# Patient Record
Sex: Male | Born: 1950
Health system: Southern US, Community
[De-identification: ages and names within clinical notes are randomized; demographics above are authoritative.]

## PROBLEM LIST (undated history)

## (undated) DIAGNOSIS — G629 Polyneuropathy, unspecified: Secondary | ICD-10-CM

## (undated) DIAGNOSIS — K269 Duodenal ulcer, unspecified as acute or chronic, without hemorrhage or perforation: Secondary | ICD-10-CM

## (undated) DIAGNOSIS — F32A Depression, unspecified: Secondary | ICD-10-CM

## (undated) DIAGNOSIS — F329 Major depressive disorder, single episode, unspecified: Secondary | ICD-10-CM

## (undated) DIAGNOSIS — J40 Bronchitis, not specified as acute or chronic: Secondary | ICD-10-CM

## (undated) DIAGNOSIS — G2581 Restless legs syndrome: Secondary | ICD-10-CM

## (undated) DIAGNOSIS — K219 Gastro-esophageal reflux disease without esophagitis: Secondary | ICD-10-CM

## (undated) HISTORY — PX: BACK SURGERY: SHX140

## (undated) HISTORY — PX: CHOLECYSTECTOMY: SHX55

## (undated) HISTORY — PX: CERVICAL FUSION: SHX112

---

## 1998-02-23 ENCOUNTER — Ambulatory Visit (HOSPITAL_COMMUNITY): Admission: RE | Admit: 1998-02-23 | Discharge: 1998-02-23 | Payer: Self-pay | Admitting: Neurosurgery

## 1998-03-06 ENCOUNTER — Encounter: Payer: Self-pay | Admitting: Neurosurgery

## 1998-03-06 ENCOUNTER — Ambulatory Visit (HOSPITAL_COMMUNITY): Admission: RE | Admit: 1998-03-06 | Discharge: 1998-03-06 | Payer: Self-pay | Admitting: Neurosurgery

## 1998-03-20 ENCOUNTER — Ambulatory Visit (HOSPITAL_COMMUNITY): Admission: RE | Admit: 1998-03-20 | Discharge: 1998-03-20 | Payer: Self-pay | Admitting: Neurosurgery

## 1998-03-20 ENCOUNTER — Encounter: Payer: Self-pay | Admitting: Neurosurgery

## 1998-04-03 ENCOUNTER — Inpatient Hospital Stay (HOSPITAL_COMMUNITY): Admission: EM | Admit: 1998-04-03 | Discharge: 1998-04-05 | Payer: Self-pay | Admitting: Emergency Medicine

## 1998-04-08 ENCOUNTER — Encounter (HOSPITAL_COMMUNITY): Admission: RE | Admit: 1998-04-08 | Discharge: 1998-07-07 | Payer: Self-pay | Admitting: Specialist

## 1998-04-17 ENCOUNTER — Encounter: Payer: Self-pay | Admitting: Neurosurgery

## 1998-04-17 ENCOUNTER — Ambulatory Visit (HOSPITAL_COMMUNITY): Admission: RE | Admit: 1998-04-17 | Discharge: 1998-04-17 | Payer: Self-pay | Admitting: Neurosurgery

## 1998-04-27 ENCOUNTER — Encounter: Payer: Self-pay | Admitting: Neurosurgery

## 1998-04-27 ENCOUNTER — Ambulatory Visit (HOSPITAL_COMMUNITY): Admission: RE | Admit: 1998-04-27 | Discharge: 1998-04-27 | Payer: Self-pay | Admitting: Neurosurgery

## 2001-02-01 ENCOUNTER — Inpatient Hospital Stay (HOSPITAL_COMMUNITY): Admission: EM | Admit: 2001-02-01 | Discharge: 2001-02-02 | Payer: Self-pay | Admitting: Cardiology

## 2003-07-17 ENCOUNTER — Encounter: Admission: RE | Admit: 2003-07-17 | Discharge: 2003-07-17 | Payer: Self-pay | Admitting: Gastroenterology

## 2003-07-27 ENCOUNTER — Ambulatory Visit (HOSPITAL_COMMUNITY): Admission: RE | Admit: 2003-07-27 | Discharge: 2003-07-28 | Payer: Self-pay | Admitting: Surgery

## 2003-07-27 ENCOUNTER — Encounter (INDEPENDENT_AMBULATORY_CARE_PROVIDER_SITE_OTHER): Payer: Self-pay | Admitting: *Deleted

## 2004-02-11 ENCOUNTER — Ambulatory Visit (HOSPITAL_COMMUNITY): Admission: RE | Admit: 2004-02-11 | Discharge: 2004-02-11 | Payer: Self-pay | Admitting: Gastroenterology

## 2004-02-11 ENCOUNTER — Encounter (INDEPENDENT_AMBULATORY_CARE_PROVIDER_SITE_OTHER): Payer: Self-pay | Admitting: Specialist

## 2004-12-24 ENCOUNTER — Emergency Department (HOSPITAL_COMMUNITY): Admission: EM | Admit: 2004-12-24 | Discharge: 2004-12-24 | Payer: Self-pay | Admitting: Emergency Medicine

## 2005-03-09 ENCOUNTER — Ambulatory Visit (HOSPITAL_COMMUNITY): Admission: RE | Admit: 2005-03-09 | Discharge: 2005-03-09 | Payer: Self-pay | Admitting: Neurology

## 2005-10-14 ENCOUNTER — Encounter: Payer: Self-pay | Admitting: Neurosurgery

## 2009-10-22 ENCOUNTER — Encounter: Admission: RE | Admit: 2009-10-22 | Discharge: 2009-10-22 | Payer: Self-pay | Admitting: Psychiatry

## 2010-10-31 NOTE — Cardiovascular Report (Signed)
[place]. [facility]  Patient:    David Solis, David Solis Visit Number: [id number] MRN: [id number]          Service Type: MED Location: 3W [phone] Attending Physician:  Talitha Givens Proc. Date: 02/02/01 Adm. Date:  02/01/2001 Disc. Date: 02/02/2001   CC:         Dr. Manuela Schwartz, Ronny Bacon, M.D. [facility]  Cardiac Catheterization Laboratory   Cardiac Catheterization  PROCEDURES PERFORMED:  Left heart catheterization with coronary angiography and left ventriculography.  INDICATIONS:  The patient is a 60 year old male with a long history of recurrent chest pain.  He is admitted to the hospital with recurrent episodes of substernal chest pain.  He was referred for cardiac catheterization to rule out coronary disease.  DESCRIPTION OF PROCEDURE:  A 6 French sheath was placed in the right femoral artery.  Standard Judkins 6 French catheters were utilized.  Contrast was Omnipaque.  Because the patient had received enoxaparin 2-1/2 hours prior to the catheterization, a Perclose vascular closure device was placed at the conclusion of the case with good hemostasis.  There were no complications.  RESULTS:  HEMODYNAMICS:  Left ventricular pressure 108/14, aortic pressure 100/62. There was no aortic valve gradient.  LEFT VENTRICULOGRAM:  Wall motion is normal.  Ejection fraction is estimated at 65%.  There is no mitral regurgitation.  CORONARY ARTERIOGRAPHY:  (Right dominant).  Left main:  Normal.  Left anterior descending:  The left anterior descending artery gives rise to a small first diagonal, normal sized second diagonal and a small third diagonal. The LAD is angiographically normal.  Left circumflex:  The left circumflex gives rise to a very small ramus intermedius and a normal sized obtuse marginal.  The circumflex is free of angiographic disease.  Right coronary artery:  The right coronary artery gives rise to a normal  sized posterior descending artery and a small posterolateral branch.  The right coronary artery is angiographically normal.  IMPRESSIONS: 1. Normal left ventricular systolic function. 2. Normal coronary arteries by angiography.  The patients chest pain appears to be noncardiac. Attending Physician:  Talitha Givens DD:  02/02/01 TD:  02/02/01 Job: [id number]

## 2010-10-31 NOTE — Op Note (Signed)
NAME:  David Solis, David Solis NO.:  [id number]   MEDICAL RECORD NO.:  [id number]                   PATIENT TYPE:  AMB   LOCATION:  ENDO                                 FACILITY:  [facility]   PHYSICIAN:  Anselmo Rod, M.D.               DATE OF BIRTH:  1951-01-25   DATE OF PROCEDURE:  02/11/2004  DATE OF DISCHARGE:                                 OPERATIVE REPORT   PROCEDURE PERFORMED:  Colonoscopy with biopsy times one.   ENDOSCOPIST:  Charna Elizabeth, M.D.   INSTRUMENT USED:  Olympus video colonoscope.   INDICATIONS FOR PROCEDURE:  The patient is a 60 year old white male with a  history of rectal bleeding, rectal pain and change in bowel habits  undergoing screening colonoscopy to rule out colonic polyps, masses, etc.   PREPROCEDURE PREPARATION:  Informed consent was procured from the patient.  The patient was fasted for eight hours prior to the procedure and prepped  with a bottle of magnesium citrate and a gallon of GoLYTELY the night prior  to the procedure.   PREPROCEDURE PHYSICAL:  The patient had stable vital signs.  Neck supple.  Chest clear to auscultation.  S1 and S2 regular.  Abdomen soft with normal  bowel sounds.   DESCRIPTION OF PROCEDURE:  The patient was placed in left lateral decubitus  position and sedated with 100 mg of Demerol and 10 mg of Versed in slow  incremental doses.  Once the patient was adequately sedated and maintained  on low flow oxygen and continuous cardiac monitoring, the Olympus video  colonoscope was advanced from the rectum to the cecum.  The appendicular  orifice and ileocecal valve were clearly visualized and photographed.  Multiple washes were done. The patient had a large amount of residual stool  in the colon.  A small sessile polyp was biopsied from the midright colon.  Small internal hemorrhoids were seen on retroflexion.  A small external  fissure was noted on anal inspection.  Small lesions could have been  missed  even though multiple washes were done.  The patient tolerated the procedure  well without immediate complications.   IMPRESSION:  1. Two small anal fissures seen on anal inspection.  2. Small internal hemorrhoids seen on retroflexion.  3. Small sessile polyp biopsied from midright colon.  4. A large amount of residual stool in the colon, small lesions could have     been missed.   RECOMMENDATIONS:  1. Await pathology results.  2. Outpatient followup in the next two weeks for further recommendations.                                               Anselmo Rod, M.D.    JNM/MEDQ  D:  02/11/2004  T:  02/11/2004  Job:  [id number]   cc:   Teena Irani. Arlyce Dice, M.D.  [address]  [place]  [place] [address]  Fax: [phone]

## 2010-10-31 NOTE — Discharge Summary (Signed)
[place]. [facility]  Patient:    ANDREW, BLASIUS Visit Number: [id number] MRN: [id number]          Service Type: MED Location: 3W [phone] Attending Physician:  Talitha Givens Dictated by:   Lavella Hammock, P.A. Adm. Date:  02/01/2001 Disc. Date: 02/02/2001   CC:         Dr. Tomasa Blase in [place]   Referring Physician Discharge [place]  DATE OF BIRTH:  22-Feb-1951  PROCEDURES: 1. Cardiac catheterization. 2. Coronary arteriogram. 3. Left ventriculogram.  HOSPITAL COURSE:  Mr. Schmieg is a 60 year old male with no known history of coronary artery disease who went to [facility] in [facility] for chest pain.  This pain was relieved with nitroglycerin while at the hospital but he had a brief episode of bradycardia with the second nitroglycerin.  The chest pain was not new but it was more prolonged than usual and he was transferred to [facility] for further evaluation and treatment.  His enzymes were negative for MI and he was scheduled for a cardiac catheterization.  The cardiac catheterization was done on February 02, 2001 and showed normal coronary arteries with an EF of 55% and no MR.  It was felt that his symptoms were not secondary to any cardiac etiology.  The patient had Perclose and that procedure went well.  Post procedure, the patient was ambulatory without difficulty and had no problems with his catheterization site.  He was considered stable for discharge with outpatient follow-up on February 02, 2001.  DISCHARGE CONDITION:  Stable.  CONSULTS:  None.  COMPLICATIONS:  None.  DISCHARGE DIAGNOSES: 1. Chest pain, negative myocardial infarction by enzymes and no coronary artery disease by catheterization. 2. History of asthma. 3. Occasional headaches and dizziness. 4. Family history of coronary artery disease. 5. Depression. 6. Status post cervical disk surgery. 7. History of periorbital swelling secondary to  nonsteroidal anti-inflammatories.  DISCHARGE INSTRUCTIONS:  ACTIVITY:  His activity level is to include no driving, sexual, or strenuous activity for two days.  DIET:  He is to stick to a low fat diet.  WOUND CARE:  He is to call the office for bleeding, swelling, or drainage at the catheterization site.  FOLLOW-UP:  He is to follow up with Dr. Myrtis Ser on a p.r.n. basis.  He is to follow up with Dr. Tomasa Blase and call for an appointment.  DISCHARGE MEDICATIONS: 1. Depakote 500 mg b.i.d. 2. Trazodone 300 mg q.h.s. 3. Paxil 40 mg q.d. 4. Xanax 0.5 mg t.i.d. p.r.n. 5. Seroquel 25 mg t.i.d. p.r.n. Dictated by:   Lavella Hammock, P.A. Attending Physician:  Talitha Givens DD:  02/02/01 TD:  02/02/01 Job: [id number]

## 2010-10-31 NOTE — Op Note (Signed)
NAME:  David Solis NO.:  [id number]   MEDICAL RECORD NO.:  [id number]                   PATIENT TYPE:  OIB   LOCATION:  2899                                 FACILITY:  [facility]   PHYSICIAN:  Abigail Miyamoto, M.D.              DATE OF BIRTH:  [birth year]   DATE OF PROCEDURE:  07/27/2003  DATE OF DISCHARGE:                                 OPERATIVE REPORT   PREOPERATIVE DIAGNOSIS:  Biliary dyskinesia.   POSTOPERATIVE DIAGNOSIS:  Biliary dyskinesia.   PROCEDURE:  Laparoscopic cholecystectomy with interoperative cholangiogram.   SURGEON:  Douglas A. Magnus Ivan, M.D.   ASSISTANT:  Angelia Mould. Derrell Lolling, M.D.   ANESTHESIA:  General endotracheal anesthesia and 0.25% Marcaine.   ESTIMATED BLOOD LOSS:  Minimal.   INDICATIONS FOR PROCEDURE:  David Solis is a 60 year old gentleman with  nausea and right upper quadrant abdominal pain.  A HIDA scan showed non-  filling of the gallbladder with minimal ejection fraction.  Ultrasound  showed no stones.  Therefore, the decision was made to proceed to the  operating room for lap chole.   FINDINGS:  The patient was found to have a chronically scarred appearing  gallbladder.  Normal cholangiogram.   PROCEDURE IN DETAIL:  The patient was brought to the operating room and  identified as David Solis.  He was placed supine on the operating table,  general anesthesia was induced.  His abdomen was then prepped and draped in  the usual sterile fashion.  Using a 15 blade, a small transverse incision  was made below the umbilicus.  The incision was carried down through the  fascia which was opened with the scalpel.  A hemostat was then used to pass  through the peritoneal cavity.  A 0 Vicryl purse-string suture was then  placed around the fascial opening.  A Hasson port was placed through the  opening and insufflation of the abdomen was begun.  Next, a 12 mm port was  placed in the patient's epigastrium and two 5 mm ports  were placed in the  patient's right flank under direct vision.  The gallbladder was grasped and  retracted above the liver bed.  Multiple adhesions to the gallbladder were  taken down bluntly.   The cystic duct was then dissected out.  An adequate window was created  around the cystic duct and was viewed almost circumferentially.  The cystic  duct was then clipped once distally and partly transected.  An Angiocath was  placed in the right upper quadrant under direct vision.  A Cholangiocath was  passed through the Angiocath and placed through the opening of the cystic  duct.  A cholangiogram was performed with contrast under direct fluoroscopy.  Good low of contrast was seen into the biliary system and duodenum without  evidence of abnormality.  The Cholangiocath was completely removed.  The  cystic duct was clipped four times proximally and then completely  transected.  The cystic artery and a posterior branch were identified and  clipped twice proximally and once distally and transected, as well.  The  gallbladder was then slowly dissected free from the liver bed with the  electrocautery.  Once the gallbladder was freed from the liver bed,  hemostasis was achieved in the bed with the cautery.  The gallbladder was  then removed through the incision at the umbilicus.  The 0 Vicryl in the  umbilicus was tied in place closing the fascial defect.  The gallbladder  fossa was again examined and hemostasis was achieved.  The abdomen was then  irrigated with normal saline.  All ports were removed under direct vision  and the abdomen was deflated.  All incisions were anesthetized with 0.25%  Marcaine and closed with 4-0 Vicryl subcuticular sutures.  Steri-Strips,  gauze, and tape were then applied.  The patient tolerated the procedure  well.  All sponge, needle and instrument counts were correct at the end of  the procedure.  The patient was then extubated in the operating room and  taken in  stable condition to the recovery room.                                               Abigail Miyamoto, M.D.    DB/MEDQ  D:  07/27/2003  T:  07/27/2003  Job:  [id number]

## 2011-06-17 DIAGNOSIS — J45909 Unspecified asthma, uncomplicated: Secondary | ICD-10-CM | POA: Diagnosis not present

## 2011-06-17 DIAGNOSIS — F329 Major depressive disorder, single episode, unspecified: Secondary | ICD-10-CM | POA: Diagnosis not present

## 2011-06-17 DIAGNOSIS — Z6830 Body mass index (BMI) 30.0-30.9, adult: Secondary | ICD-10-CM | POA: Diagnosis not present

## 2011-06-17 DIAGNOSIS — E669 Obesity, unspecified: Secondary | ICD-10-CM | POA: Diagnosis not present

## 2011-06-17 DIAGNOSIS — F332 Major depressive disorder, recurrent severe without psychotic features: Secondary | ICD-10-CM | POA: Diagnosis not present

## 2011-06-17 DIAGNOSIS — F331 Major depressive disorder, recurrent, moderate: Secondary | ICD-10-CM | POA: Diagnosis not present

## 2011-06-17 DIAGNOSIS — F319 Bipolar disorder, unspecified: Secondary | ICD-10-CM | POA: Diagnosis not present

## 2011-06-17 DIAGNOSIS — R45851 Suicidal ideations: Secondary | ICD-10-CM | POA: Diagnosis not present

## 2011-06-17 DIAGNOSIS — Z818 Family history of other mental and behavioral disorders: Secondary | ICD-10-CM | POA: Diagnosis not present

## 2011-06-17 DIAGNOSIS — Z79899 Other long term (current) drug therapy: Secondary | ICD-10-CM | POA: Diagnosis not present

## 2011-06-17 DIAGNOSIS — G2581 Restless legs syndrome: Secondary | ICD-10-CM | POA: Diagnosis not present

## 2011-06-19 DIAGNOSIS — F332 Major depressive disorder, recurrent severe without psychotic features: Secondary | ICD-10-CM | POA: Diagnosis not present

## 2011-06-26 DIAGNOSIS — F332 Major depressive disorder, recurrent severe without psychotic features: Secondary | ICD-10-CM | POA: Diagnosis not present

## 2011-06-29 DIAGNOSIS — F319 Bipolar disorder, unspecified: Secondary | ICD-10-CM | POA: Diagnosis not present

## 2011-06-29 DIAGNOSIS — J45909 Unspecified asthma, uncomplicated: Secondary | ICD-10-CM | POA: Diagnosis not present

## 2011-07-01 DIAGNOSIS — F331 Major depressive disorder, recurrent, moderate: Secondary | ICD-10-CM | POA: Diagnosis not present

## 2011-07-01 DIAGNOSIS — F332 Major depressive disorder, recurrent severe without psychotic features: Secondary | ICD-10-CM | POA: Diagnosis not present

## 2011-07-03 DIAGNOSIS — F332 Major depressive disorder, recurrent severe without psychotic features: Secondary | ICD-10-CM | POA: Diagnosis not present

## 2011-07-08 DIAGNOSIS — F329 Major depressive disorder, single episode, unspecified: Secondary | ICD-10-CM | POA: Diagnosis not present

## 2011-07-08 DIAGNOSIS — J45909 Unspecified asthma, uncomplicated: Secondary | ICD-10-CM | POA: Diagnosis not present

## 2011-07-08 DIAGNOSIS — G2581 Restless legs syndrome: Secondary | ICD-10-CM | POA: Diagnosis not present

## 2011-07-08 DIAGNOSIS — F332 Major depressive disorder, recurrent severe without psychotic features: Secondary | ICD-10-CM | POA: Diagnosis not present

## 2011-07-10 DIAGNOSIS — F332 Major depressive disorder, recurrent severe without psychotic features: Secondary | ICD-10-CM | POA: Diagnosis not present

## 2011-07-13 DIAGNOSIS — F332 Major depressive disorder, recurrent severe without psychotic features: Secondary | ICD-10-CM | POA: Diagnosis not present

## 2011-07-15 DIAGNOSIS — F332 Major depressive disorder, recurrent severe without psychotic features: Secondary | ICD-10-CM | POA: Diagnosis not present

## 2011-07-16 DIAGNOSIS — F331 Major depressive disorder, recurrent, moderate: Secondary | ICD-10-CM | POA: Diagnosis not present

## 2011-07-16 DIAGNOSIS — F333 Major depressive disorder, recurrent, severe with psychotic symptoms: Secondary | ICD-10-CM | POA: Diagnosis not present

## 2011-07-20 DIAGNOSIS — F332 Major depressive disorder, recurrent severe without psychotic features: Secondary | ICD-10-CM | POA: Diagnosis not present

## 2011-07-31 DIAGNOSIS — I69998 Other sequelae following unspecified cerebrovascular disease: Secondary | ICD-10-CM | POA: Diagnosis not present

## 2011-07-31 DIAGNOSIS — R29898 Other symptoms and signs involving the musculoskeletal system: Secondary | ICD-10-CM | POA: Diagnosis not present

## 2011-07-31 DIAGNOSIS — F332 Major depressive disorder, recurrent severe without psychotic features: Secondary | ICD-10-CM | POA: Diagnosis not present

## 2011-07-31 DIAGNOSIS — J45909 Unspecified asthma, uncomplicated: Secondary | ICD-10-CM | POA: Diagnosis not present

## 2011-08-04 DIAGNOSIS — F331 Major depressive disorder, recurrent, moderate: Secondary | ICD-10-CM | POA: Diagnosis not present

## 2011-08-07 DIAGNOSIS — F332 Major depressive disorder, recurrent severe without psychotic features: Secondary | ICD-10-CM | POA: Diagnosis not present

## 2011-08-07 DIAGNOSIS — F329 Major depressive disorder, single episode, unspecified: Secondary | ICD-10-CM | POA: Diagnosis not present

## 2011-08-14 DIAGNOSIS — F332 Major depressive disorder, recurrent severe without psychotic features: Secondary | ICD-10-CM | POA: Diagnosis not present

## 2011-08-14 DIAGNOSIS — F339 Major depressive disorder, recurrent, unspecified: Secondary | ICD-10-CM | POA: Diagnosis not present

## 2011-08-19 DIAGNOSIS — F331 Major depressive disorder, recurrent, moderate: Secondary | ICD-10-CM | POA: Diagnosis not present

## 2011-08-21 ENCOUNTER — Emergency Department (INDEPENDENT_AMBULATORY_CARE_PROVIDER_SITE_OTHER)
Admission: EM | Admit: 2011-08-21 | Discharge: 2011-08-21 | Disposition: A | Discharge status: Home / Self Care | Payer: 59 | Source: Home / Self Care | Attending: Emergency Medicine | Admitting: Emergency Medicine

## 2011-08-21 ENCOUNTER — Encounter: Payer: Self-pay | Admitting: Emergency Medicine

## 2011-08-21 DIAGNOSIS — M26629 Arthralgia of temporomandibular joint, unspecified side: Secondary | ICD-10-CM | POA: Diagnosis not present

## 2011-08-21 HISTORY — DX: Major depressive disorder, single episode, unspecified: F32.9

## 2011-08-21 HISTORY — DX: Depression, unspecified: F32.A

## 2011-08-21 MED ORDER — AMOXICILLIN-POT CLAVULANATE 875-125 MG PO TABS: 1.0000 | ORAL_TABLET | Freq: Two times a day (BID) | ORAL | Status: AC

## 2011-08-21 MED ORDER — NEOMYCIN-POLYMYXIN-HC 3.5-10000-1 OT SUSP: 4.0000 [drp] | Freq: Three times a day (TID) | OTIC | Status: AC

## 2011-08-21 NOTE — ED Notes (Signed)
Right ear/jaw pain x 4 weeks, runny nose, sore throat

## 2011-08-21 NOTE — ED Provider Notes (Signed)
History     CSN: [id number]  Arrival date & time 08/21/11  1649   First MD Initiated Contact with Patient 08/21/11 1659      Chief Complaint  Patient presents with  . Otalgia    (Consider location/radiation/quality/duration/timing/severity/associated sxs/prior treatment) HPI David Solis is a 61 y.o. male who complains of onset of cold symptoms for almost a month.  Complains of jaw pain only on the right side and went to the dentist who thought that he may have TMJ. Dentist did panoramic X-rays which were supposedly normal.  The dentist made him a mouth guard which will be ready to use in 5 days. He thinks it may be an ear infection. At the same time he has been having some mild upper respirations symptoms including a runny nose and a sore throat. He states that he had a 99 temperature a few days ago but no eye fevers, chills, nausea, vomiting. Of note, he has been doing ECT treatment for depression over the last 5 weeks. He has been using ibuprofen 800 mg which is helping a little bit but not very much. The wife is concerned because he has been having to battle depression and this pain is not helping him very much. No current suicidal ideations.  Past Medical History  Diagnosis Date  . Depression     Past Surgical History  Procedure Date  . Cholecystectomy   . Back surgery     No family history on file.  History  Substance Use Topics  . Smoking status: Not on file  . Smokeless tobacco: Not on file  . Alcohol Use: No      Review of Systems  All other systems reviewed and are negative.    Allergies  Review of patient's allergies indicates no known allergies.  Home Medications   Current Outpatient Rx  Name Route Sig Dispense Refill  . CYCLOBENZAPRINE HCL 10 MG PO TABS Oral Take 10 mg by mouth 3 (three) times daily as needed.    . IBUPROFEN 800 MG PO TABS Oral Take 800 mg by mouth every 8 (eight) hours as needed.    Marland Kitchen NORTRIPTYLINE HCL 50 MG PO CAPS Oral Take 50 mg by  mouth at bedtime.    . AMOXICILLIN-POT CLAVULANATE 875-125 MG PO TABS Oral Take 1 tablet by mouth 2 (two) times daily. 16 tablet 0  . NEOMYCIN-POLYMYXIN-HC 3.5-10000-1 OT SUSP Right Ear Place 4 drops into the right ear 3 (three) times daily. 10 mL 0    BP 133/85  Pulse 107  Temp(Src) 98.6 F (37 C) (Oral)  Resp 12  Ht 6' (1.829 m)  Wt 224 lb (101.606 kg)  BMI 30.38 kg/m2  SpO2 98%  Physical Exam  Nursing note and vitals reviewed. Constitutional: He is oriented to person, place, and time. He appears well-developed and well-nourished.  HENT:  Head: Normocephalic and atraumatic.    Right Ear: Tympanic membrane, external ear and ear canal normal.  Left Ear: Tympanic membrane, external ear and ear canal normal.  Nose: Nose normal.  Mouth/Throat: Posterior oropharyngeal erythema present. No oropharyngeal exudate or posterior oropharyngeal edema.       R canal mild swelling and erythema, no exudate, TM appears normal.  L canal and TM normal.  Eyes: No scleral icterus.  Neck: Normal range of motion. Neck supple. No spinous process tenderness present. No rigidity. No edema present.       Jaw with FROM, opening and clenching  Cardiovascular: Regular rhythm and normal heart sounds.  Pulmonary/Chest: Effort normal and breath sounds normal. No respiratory distress.  Neurological: He is alert and oriented to person, place, and time.  Skin: Skin is warm and dry.  Psychiatric: He has a normal mood and affect. His speech is normal.    ED Course  Procedures (including critical care time)  Labs Reviewed - No data to display No results found.   1. Facial pain   2. TMJ arthralgia       MDM  I do not believe that he has an otitis media, however he may have a slight otitis externa on the right side. I gave him a prescription for amoxicillin as well as antibiotic eardrops to use for the next week. He very well may have a bit of a sinus infection versus tonsillitis which is referring to  the jaw. I did not see any evidence of cardiac etiology. The most likely diagnosis is TMJ secondary to the ECT treatment. The lytic impulses are likely causing his jaw to spasm and may have even caused a possible subluxation which is now experiencing and is tenderness of the joint. I would like him to followup with his dentist and even consider a referral to ENT or OMFS if not improving.   Can take tylenol every 6 hours or motrin every 8 hours for pain or fever.  Marlaine Hind, MD 08/21/11 1736

## 2011-09-16 ENCOUNTER — Encounter: Payer: Self-pay | Admitting: *Deleted

## 2011-09-16 ENCOUNTER — Emergency Department (INDEPENDENT_AMBULATORY_CARE_PROVIDER_SITE_OTHER)
Admission: EM | Admit: 2011-09-16 | Discharge: 2011-09-16 | Disposition: A | Discharge status: Home / Self Care | Payer: 59 | Source: Home / Self Care | Attending: Emergency Medicine | Admitting: Emergency Medicine

## 2011-09-16 DIAGNOSIS — H9209 Otalgia, unspecified ear: Secondary | ICD-10-CM | POA: Diagnosis not present

## 2011-09-16 DIAGNOSIS — H9201 Otalgia, right ear: Secondary | ICD-10-CM

## 2011-09-16 MED ORDER — CIPROFLOXACIN-HYDROCORTISONE 0.2-1 % OT SUSP: 3.0000 [drp] | Freq: Two times a day (BID) | OTIC | Status: AC

## 2011-09-16 NOTE — ED Notes (Signed)
Patient c/o right ear drainage and both ears feeling "stopped up". C/o some ringing.

## 2011-09-16 NOTE — ED Provider Notes (Signed)
History     CSN: [id number]  Arrival date & time 09/16/11  1310   First MD Initiated Contact with Patient 09/16/11 1328      Chief Complaint  Patient presents with  . Otalgia    (Consider location/radiation/quality/duration/timing/severity/associated sxs/prior treatment) HPI Refer to previous patient discussing his right ear pain.  He's been having right ear pain for the last few months.  He was treated with antibiotics orally as well as eardrops that tended to make him feel better but when she stopped the medicine he get worse again.  He describes it as a tight sharp pain.  He is also been to the dentist who did x-rays and made a bite guard for him.  He has not been to an ear nose throat Dr.  He did have a history of ECT but is no longer doing that.  He has been having some drainage out of that ear that he describes as crusty.  No fever, chills.  He has been having some allergy symptoms such as runny nose and congestion.  Past Medical History  Diagnosis Date  . Depression   . Asthma     Past Surgical History  Procedure Date  . Cholecystectomy   . Back surgery     Family History  Problem Relation Age of Onset  . Cancer Mother     breast    History  Substance Use Topics  . Smoking status: Never Smoker   . Smokeless tobacco: Not on file  . Alcohol Use: No      Review of Systems  All other systems reviewed and are negative.    Allergies  Review of patient's allergies indicates no known allergies.  Home Medications   Current Outpatient Rx  Name Route Sig Dispense Refill  . CIPROFLOXACIN-HYDROCORTISONE 0.2-1 % OT SUSP Right Ear Place 3 drops into the right ear 2 (two) times daily. 10 mL 0  . CYCLOBENZAPRINE HCL 10 MG PO TABS Oral Take 10 mg by mouth 3 (three) times daily as needed.    . IBUPROFEN 800 MG PO TABS Oral Take 800 mg by mouth every 8 (eight) hours as needed.    Marland Kitchen NORTRIPTYLINE HCL 50 MG PO CAPS Oral Take 50 mg by mouth at bedtime.      BP 161/100   Pulse 109  Temp(Src) 98.2 F (36.8 C) (Oral)  Resp 16  Ht 6' (1.829 m)  Wt 222 lb (100.699 kg)  BMI 30.11 kg/m2  SpO2 100%  Physical Exam  Nursing note and vitals reviewed. Constitutional: He is oriented to person, place, and time. He appears well-developed and well-nourished.  HENT:  Head: Normocephalic and atraumatic.  Right Ear: Tympanic membrane, external ear and ear canal normal.  Left Ear: Tympanic membrane, external ear and ear canal normal.  Nose: Rhinorrhea present.  Mouth/Throat: Posterior oropharyngeal erythema present. No oropharyngeal exudate or posterior oropharyngeal edema.       Tympanic membranes have minimal erythema bilaterally.  The right ear canal is more swollen than the left.  No crusting or drainage or perforation is seen.  He has mild to moderate cerumen bilaterally, but I can visualize the tympanic membranes.  I do not see any foreign bodies.  No evidence of fungal infection.  He has tenderness while pressing the tragus.  Eyes: No scleral icterus.  Neck: Neck supple.  Cardiovascular: Regular rhythm and normal heart sounds.   Pulmonary/Chest: Effort normal and breath sounds normal. No respiratory distress.  Neurological: He is alert and oriented  to person, place, and time.  Skin: Skin is warm and dry.  Psychiatric: He has a normal mood and affect. His speech is normal.    ED Course  Procedures (including critical care time)  Labs Reviewed - No data to display No results found.   1. Right ear pain       MDM   I changed the antibiotic eardrops to Cipro HC and we also did attempt to get the cerumen out of his ear here in clinic.  We did get some out in he feels a little better afterwards. I've advised him to use these eardrops for the next week, but if not improving then he'll need to see ENT to further evaluate what is causing his continued ear pain.  Could be referred from his TMJ, sinuses, throat.  Otherwise I do not see any cause of his ear pain  other than a mild case of otitis externa.     Marlaine Hind, MD 09/16/11 1345

## 2011-09-30 ENCOUNTER — Encounter (HOSPITAL_BASED_OUTPATIENT_CLINIC_OR_DEPARTMENT_OTHER): Payer: Self-pay | Admitting: *Deleted

## 2011-09-30 ENCOUNTER — Emergency Department (HOSPITAL_BASED_OUTPATIENT_CLINIC_OR_DEPARTMENT_OTHER)
Admission: EM | Admit: 2011-09-30 | Discharge: 2011-09-30 | Disposition: A | Discharge status: Home / Self Care | Payer: 59 | Attending: Emergency Medicine | Admitting: Emergency Medicine

## 2011-09-30 ENCOUNTER — Emergency Department (INDEPENDENT_AMBULATORY_CARE_PROVIDER_SITE_OTHER): Payer: 59

## 2011-09-30 DIAGNOSIS — N289 Disorder of kidney and ureter, unspecified: Secondary | ICD-10-CM

## 2011-09-30 DIAGNOSIS — M8708 Idiopathic aseptic necrosis of bone, other site: Secondary | ICD-10-CM | POA: Diagnosis not present

## 2011-09-30 DIAGNOSIS — J45909 Unspecified asthma, uncomplicated: Secondary | ICD-10-CM | POA: Insufficient documentation

## 2011-09-30 DIAGNOSIS — R634 Abnormal weight loss: Secondary | ICD-10-CM | POA: Diagnosis not present

## 2011-09-30 DIAGNOSIS — R350 Frequency of micturition: Secondary | ICD-10-CM | POA: Insufficient documentation

## 2011-09-30 DIAGNOSIS — R202 Paresthesia of skin: Secondary | ICD-10-CM

## 2011-09-30 DIAGNOSIS — R209 Unspecified disturbances of skin sensation: Secondary | ICD-10-CM | POA: Diagnosis not present

## 2011-09-30 LAB — COMPREHENSIVE METABOLIC PANEL
ALT: 22 U/L (ref 0–53)
AST: 19 U/L (ref 0–37)
Albumin: 4.4 g/dL (ref 3.5–5.2)
Alkaline Phosphatase: 66 U/L (ref 39–117)
CO2: 26 mEq/L (ref 19–32)
Chloride: 105 mEq/L (ref 96–112)
Creatinine, Ser: 1.1 mg/dL (ref 0.50–1.35)
GFR calc non Af Amer: 71 mL/min — ABNORMAL LOW (ref >90)
Potassium: 4.4 mEq/L (ref 3.5–5.1)
Sodium: 142 mEq/L (ref 135–145)
Total Bilirubin: 0.4 mg/dL (ref 0.3–1.2)

## 2011-09-30 LAB — CBC
HCT: 40.3 % (ref 39.0–52.0)
MCHC: 35 g/dL (ref 30.0–36.0)
RDW: 12.5 % (ref 11.5–15.5)
WBC: 5.3 10*3/uL (ref 4.0–10.5)

## 2011-09-30 LAB — DIFFERENTIAL
Basophils Absolute: 0 10*3/uL (ref 0.0–0.1)
Basophils Relative: 0 % (ref 0–1)
Lymphocytes Relative: 27 % (ref 12–46)
Monocytes Absolute: 0.4 10*3/uL (ref 0.1–1.0)
Neutro Abs: 3.3 10*3/uL (ref 1.7–7.7)
Neutrophils Relative %: 63 % (ref 43–77)

## 2011-09-30 LAB — URINE MICROSCOPIC-ADD ON

## 2011-09-30 LAB — URINALYSIS, ROUTINE W REFLEX MICROSCOPIC
Glucose, UA: NEGATIVE mg/dL
Ketones, ur: NEGATIVE mg/dL
Leukocytes, UA: NEGATIVE
Protein, ur: NEGATIVE mg/dL
pH: 5.5 (ref 5.0–8.0)

## 2011-09-30 MED ORDER — IOHEXOL 300 MG/ML  SOLN
100.0000 mL | Freq: Once | INTRAMUSCULAR | Status: AC | PRN
  Administered 2011-09-30: 100 mL via INTRAVENOUS

## 2011-09-30 MED ORDER — TRAMADOL HCL 50 MG PO TABS: 50.0000 mg | ORAL_TABLET | Freq: Four times a day (QID) | ORAL | Status: AC | PRN

## 2011-09-30 MED ORDER — ONDANSETRON HCL 4 MG/2ML IJ SOLN
4.0000 mg | Freq: Once | INTRAMUSCULAR | Status: AC
  Administered 2011-09-30: 4 mg via INTRAVENOUS
  Filled 2011-09-30: qty 2

## 2011-09-30 MED ORDER — IOHEXOL 300 MG/ML  SOLN
36.0000 mL | Freq: Once | INTRAMUSCULAR | Status: AC | PRN
  Administered 2011-09-30: 36 mL via ORAL

## 2011-09-30 NOTE — Discharge Instructions (Signed)
It is not clear today why you have the skin hypersensitivity or the feeling of urinary frequency.  You do not have a urinary tract infection at this time.  Nor is there any abnormalities on your CT scan of your abdomen to suggest an intra-abdominal infection or cancer at this time.  I recommend you followup with your primary care physician in the next week for reevaluation of your symptoms.  Paresthesia Paresthesia is an abnormal burning or prickling sensation. This sensation is generally felt in the hands, arms, legs, or feet. However, it may occur in any part of the body. It is usually not painful. The feeling may be described as:  Tingling or numbness.   "Pins and needles."   Skin crawling.   Buzzing.   Limbs "falling asleep."   Itching.  Most people experience temporary (transient) paresthesia at some time in their lives. CAUSES  Paresthesia may occur when you breathe too quickly (hyperventilation). It can also occur without any apparent cause. Commonly, paresthesia occurs when pressure is placed on a nerve. The feeling quickly goes away once the pressure is removed. For some people, however, paresthesia is a long-lasting (chronic) condition caused by an underlying disorder. The underlying disorder may be:  A traumatic, direct injury to nerves. Examples include a:   Broken (fractured) neck.   Fractured skull.   A disorder affecting the brain and spinal cord (central nervous system). Examples include:   Transverse myelitis.   Encephalitis.   Transient ischemic attack.   Multiple sclerosis.   Stroke.   Tumor or blood vessel problems, such as an arteriovenous malformation pressing against the brain or spinal cord.   A condition that damages the peripheral nerves (peripheral neuropathy). Peripheral nerves are not part of the brain and spinal cord. These conditions include:   Diabetes.   Peripheral vascular disease.   Nerve entrapment syndromes, such as carpal tunnel  syndrome.   Shingles.   Hypothyroidism.   Vitamin B12 deficiencies.   Alcoholism.   Heavy metal poisoning (lead, arsenic).   Rheumatoid arthritis.   Systemic lupus erythematosus.  DIAGNOSIS  Your caregiver will attempt to find the underlying cause of your paresthesia. Your caregiver may:  Take your medical history.   Perform a physical exam.   Order various lab tests.   Order imaging tests.  TREATMENT  Treatment for paresthesia depends on the underlying cause. HOME CARE INSTRUCTIONS  Avoid drinking alcohol.   You may consider massage or acupuncture to help relieve your symptoms.   Keep all follow-up appointments as directed by your caregiver.  SEEK IMMEDIATE MEDICAL CARE IF:   You feel weak.   You have trouble walking or moving.   You have problems with speech or vision.   You feel confused.   You cannot control your bladder or bowel movements.   You feel numbness after an injury.   You faint.   Your burning or prickling feeling gets worse when walking.   You have pain, cramps, or dizziness.   You develop a rash.  MAKE SURE YOU:  Understand these instructions.   Will watch your condition.   Will get help right away if you are not doing well or get worse.  Document Released: 05/22/2002 Document Revised: 05/21/2011 Document Reviewed: 02/20/2011 [facility] Patient Information 2012 [place], [place].

## 2011-09-30 NOTE — ED Provider Notes (Addendum)
History     CSN: [id number]  Arrival date & time 09/30/11  1429   First MD Initiated Contact with Patient 09/30/11 1541      Chief Complaint  Patient presents with  . Urinary Frequency    (Consider location/radiation/quality/duration/timing/severity/associated sxs/prior treatment) HPI Comments: Patient presents after not feeling well for approximately one month.  Patient notes that he has a diffuse tingling all over his skin.  His skin seems hypersensitive in tender to touch anywhere he even lightly.  No fevers.  Patient has had occasional night sweats and some chills.  Patient also notes a 50 pound weight loss since December without changes in dietary or exercise habits.  He notes no focal chest pain, shortness of breath or abdominal pain.  He's had some mild nausea and constipation.  Over the last several days he is noted some urinary frequency with some dysuria symptoms as well.  No hematuria.  Patient notes some mild rectal bleeding associated with his constipation after straining events.  Patient did attempt to see his primary care physician today but they have no openings.  Patient is here because his symptoms seem to be getting worse and the patient is uncomfortable enough that he can't sleep.  Patient is a 61 y.o. male presenting with frequency. The history is provided by the patient and the spouse. No language interpreter was used.  Urinary Frequency This is a new problem. The current episode started more than 2 days ago. Pertinent negatives include no chest pain, no abdominal pain, no headaches and no shortness of breath. The symptoms are aggravated by nothing.    Past Medical History  Diagnosis Date  . Depression   . Asthma     Past Surgical History  Procedure Date  . Cholecystectomy   . Back surgery     Family History  Problem Relation Age of Onset  . Cancer Mother     breast    History  Substance Use Topics  . Smoking status: Never Smoker   . Smokeless tobacco:  Not on file  . Alcohol Use: No      Review of Systems  Constitutional: Positive for chills, appetite change, fatigue and unexpected weight change. Negative for fever.  HENT: Negative.   Eyes: Negative.  Negative for discharge and redness.  Respiratory: Negative.  Negative for cough and shortness of breath.   Cardiovascular: Negative.  Negative for chest pain.  Gastrointestinal: Positive for nausea and blood in stool. Negative for vomiting and abdominal pain.  Genitourinary: Positive for frequency. Negative for hematuria.  Musculoskeletal: Negative.  Negative for back pain.  Skin: Negative.  Negative for color change and rash.  Neurological: Negative for syncope and headaches.  Hematological: Negative.  Negative for adenopathy.  Psychiatric/Behavioral: Negative.  Negative for confusion.  All other systems reviewed and are negative.    Allergies  Review of patient's allergies indicates no known allergies.  Home Medications   Current Outpatient Rx  Name Route Sig Dispense Refill  . IBUPROFEN 800 MG PO TABS Oral Take 800 mg by mouth every 8 (eight) hours as needed. Patient used this medication pain.      BP 134/91  Pulse 95  Temp(Src) 98.2 F (36.8 C) (Oral)  Resp 18  Ht 6' (1.829 m)  Wt 220 lb (99.791 kg)  BMI 29.84 kg/m2  SpO2 99%  Physical Exam  Nursing note and vitals reviewed. Constitutional: He is oriented to person, place, and time. He appears well-developed and well-nourished.  Non-toxic appearance. He does  not have a sickly appearance.  HENT:  Head: Normocephalic and atraumatic.  Eyes: Conjunctivae, EOM and lids are normal. Pupils are equal, round, and reactive to light.  Neck: Trachea normal, normal range of motion and full passive range of motion without pain. Neck supple.  Cardiovascular: Normal rate, regular rhythm and normal heart sounds.   Pulmonary/Chest: Effort normal and breath sounds normal. No respiratory distress.  Abdominal: Soft. Normal  appearance. He exhibits no distension. There is tenderness. There is no rebound and no CVA tenderness.        there is diffuse abdominal tenderness but this may be related to the patient's skin being hypersensitive per his report.  Musculoskeletal: Normal range of motion.  Neurological: He is alert and oriented to person, place, and time. He has normal strength.  Skin: Skin is warm, dry and intact. No rash noted.       Patient is very sensitive to touch anywhere that I examine him.  Psychiatric: He has a normal mood and affect. His behavior is normal. Judgment and thought content normal.    ED Course  Procedures (including critical care time)  Results for orders placed during the hospital encounter of 09/30/11  URINALYSIS, ROUTINE W REFLEX MICROSCOPIC      Component Value Range   Color, Urine YELLOW  YELLOW    APPearance CLEAR  CLEAR    Specific Gravity, Urine 1.013  1.005 - 1.030    pH 5.5  5.0 - 8.0    Glucose, UA NEGATIVE  NEGATIVE (mg/dL)   Hgb urine dipstick TRACE (*) NEGATIVE    Bilirubin Urine NEGATIVE  NEGATIVE    Ketones, ur NEGATIVE  NEGATIVE (mg/dL)   Protein, ur NEGATIVE  NEGATIVE (mg/dL)   Urobilinogen, UA 0.2  0.0 - 1.0 (mg/dL)   Nitrite NEGATIVE  NEGATIVE    Leukocytes, UA NEGATIVE  NEGATIVE   URINE MICROSCOPIC-ADD ON      Component Value Range   Squamous Epithelial / LPF RARE  RARE    WBC, UA 3-6  <3 (WBC/hpf)   RBC / HPF 0-2  <3 (RBC/hpf)   Bacteria, UA RARE  RARE   CBC      Component Value Range   WBC 5.3  4.0 - 10.5 (K/uL)   RBC 4.54  4.22 - 5.81 (MIL/uL)   Hemoglobin 14.1  13.0 - 17.0 (g/dL)   HCT 16.1  09.6 - 04.5 (%)   MCV 88.8  78.0 - 100.0 (fL)   MCH 31.1  26.0 - 34.0 (pg)   MCHC 35.0  30.0 - 36.0 (g/dL)   RDW 40.9  81.1 - 91.4 (%)   Platelets 181  150 - 400 (K/uL)  DIFFERENTIAL      Component Value Range   Neutrophils Relative 63  43 - 77 (%)   Neutro Abs 3.3  1.7 - 7.7 (K/uL)   Lymphocytes Relative 27  12 - 46 (%)   Lymphs Abs 1.4  0.7 - 4.0  (K/uL)   Monocytes Relative 8  3 - 12 (%)   Monocytes Absolute 0.4  0.1 - 1.0 (K/uL)   Eosinophils Relative 1  0 - 5 (%)   Eosinophils Absolute 0.1  0.0 - 0.7 (K/uL)   Basophils Relative 0  0 - 1 (%)   Basophils Absolute 0.0  0.0 - 0.1 (K/uL)  COMPREHENSIVE METABOLIC PANEL      Component Value Range   Sodium 142  135 - 145 (mEq/L)   Potassium 4.4  3.5 - 5.1 (mEq/L)  Chloride 105  96 - 112 (mEq/L)   CO2 26  19 - 32 (mEq/L)   Glucose, Bld 103 (*) 70 - 99 (mg/dL)   BUN 12  6 - 23 (mg/dL)   Creatinine, Ser 4.09  0.50 - 1.35 (mg/dL)   Calcium 81.1  8.4 - 10.5 (mg/dL)   Total Protein 7.2  6.0 - 8.3 (g/dL)   Albumin 4.4  3.5 - 5.2 (g/dL)   AST 19  0 - 37 (U/L)   ALT 22  0 - 53 (U/L)   Alkaline Phosphatase 66  39 - 117 (U/L)   Total Bilirubin 0.4  0.3 - 1.2 (mg/dL)   GFR calc non Af Amer 71 (*) >90 (mL/min)   GFR calc Af Amer 82 (*) >90 (mL/min)  PHOSPHORUS      Component Value Range   Phosphorus 3.0  2.3 - 4.6 (mg/dL)   Ct Abdomen Pelvis W Contrast  09/30/2011  *RADIOLOGY REPORT*  Clinical Data: 50 pound weight loss over 3-4 months.  Diffuse tingling sensation.  Nausea and urinary frequency.  CT ABDOMEN AND PELVIS WITH CONTRAST  Technique:  Multidetector CT imaging of the abdomen and pelvis was performed following the standard protocol during bolus administration of intravenous contrast.  Contrast: 36mL OMNIPAQUE IOHEXOL 300 MG/ML  SOLN, OMNIPAQUE IOHEXOL 300 MG/ML  SOLN  Comparison: Chest and two views abdomen earlier the same day.  Findings: There is no pleural or pericardial effusion.  The lung bases are clear.  The patient is status post cholecystectomy.  Two-to-three small low attenuating lesions in the liver are compatible with cysts.  The liver otherwise appears normal.  The spleen, adrenal glands, pancreas, left kidney appear normal.  A small hyperattenuating exophytic lesion is seen off the midpole of the right kidney measuring 0.9 cm in diameter.  The right kidney otherwise  appears normal.  The stomach and small and large bowel appear normal.  Urinary bladder, seminal vesicles and prostate gland are unremarkable.  No lymphadenopathy or fluid.  The patient has avascular necrosis of the hips bilaterally without femoral head collapse.  Changes appear more marked on the left. There is also some degenerative disease about the hips.  No other focal bony abnormality.  IMPRESSION:  1.  No acute finding. 2.  Small exophytic lesion off of the mid pole right kidney cannot be definitively characterized.  MRI with and without contrast is recommended to exclude a small renal cell carcinoma. 3.  Avascular necrosis of the hips without femoral head collapse.  Original Report Authenticated By: Bernadene Bell. D'ALESSIO, M.D.   Dg Abd Acute W/chest  09/30/2011  *RADIOLOGY REPORT*  Clinical Data: Urinary frequency  ACUTE ABDOMEN SERIES (ABDOMEN 2 VIEW & CHEST 1 VIEW)  Comparison: None  Findings: The heart size appears normal.  No pleural effusion or edema.  No airspace consolidation.  Surgical clips are identified within the right upper quadrant of the abdomen.  The bowel gas pattern appears nonobstructed.  There is gas and stool noted within the colon up to the level of the rectum. Cholecystectomy clips present within the right upper quadrant of the abdomen.  IMPRESSION:  1.  Nonobstructive bowel gas pattern. 2.  No active cardiopulmonary abnormalities.  Original Report Authenticated By: Rosealee Albee, M.D.      MDM  Patient does not clearly have a UTI and his urinalysis today.  He does have symptoms that are concerning for possible malignancy so I will begin to obtain further laboratory studies and a chest x-ray  with acute abdominal series.        Nat Christen, MD 09/30/11 1614  At this point in time I have no clear etiology for the patient's diffuse paresthesias and skin hypersensitivity.  I also do not have a explanation for the patient's weight loss and general feelings of  malaise.  His urine is not infected to suggest UTI.  I will recommend the patient followup with his primary care physician as well as with the urologist for further evaluation of these symptoms.  He is feeling better after the pain medicine here as well.  I'll prescribe him a small amount of pain medicine to be used at home.  Nat Christen, MD 09/30/11 2019

## 2011-09-30 NOTE — ED Notes (Signed)
Pt c/o urinary freq and also skin " itching and numbness x 1 month

## 2011-09-30 NOTE — ED Notes (Addendum)
Pt reports entire body tingling that is intermittent in nature x 1 or more months. Reports periodic changes in urinary frequency, has noticed an increase in freq. Reports decreased appetite and activity level. Reports increased constipation unrelieved by miralax. Denies V D CP LOC SOB but + N. Reports occasional dysuria. Reports lower abd pain. Reports no change in pain with or without food intake.

## 2012-01-14 DIAGNOSIS — M2669 Other specified disorders of temporomandibular joint: Secondary | ICD-10-CM | POA: Diagnosis not present

## 2012-01-14 DIAGNOSIS — M26629 Arthralgia of temporomandibular joint, unspecified side: Secondary | ICD-10-CM | POA: Diagnosis not present

## 2012-02-01 DIAGNOSIS — E279 Disorder of adrenal gland, unspecified: Secondary | ICD-10-CM | POA: Diagnosis not present

## 2012-02-01 DIAGNOSIS — R945 Abnormal results of liver function studies: Secondary | ICD-10-CM | POA: Diagnosis not present

## 2012-02-01 DIAGNOSIS — F489 Nonpsychotic mental disorder, unspecified: Secondary | ICD-10-CM | POA: Diagnosis not present

## 2012-02-01 DIAGNOSIS — F411 Generalized anxiety disorder: Secondary | ICD-10-CM | POA: Diagnosis not present

## 2012-02-01 DIAGNOSIS — F329 Major depressive disorder, single episode, unspecified: Secondary | ICD-10-CM | POA: Diagnosis not present

## 2012-02-01 DIAGNOSIS — R0789 Other chest pain: Secondary | ICD-10-CM | POA: Diagnosis not present

## 2012-02-01 DIAGNOSIS — F332 Major depressive disorder, recurrent severe without psychotic features: Secondary | ICD-10-CM | POA: Diagnosis not present

## 2012-02-01 DIAGNOSIS — R079 Chest pain, unspecified: Secondary | ICD-10-CM | POA: Diagnosis not present

## 2012-02-01 DIAGNOSIS — K859 Acute pancreatitis without necrosis or infection, unspecified: Secondary | ICD-10-CM | POA: Diagnosis not present

## 2012-02-01 DIAGNOSIS — R109 Unspecified abdominal pain: Secondary | ICD-10-CM | POA: Diagnosis not present

## 2012-02-01 DIAGNOSIS — E785 Hyperlipidemia, unspecified: Secondary | ICD-10-CM | POA: Diagnosis present

## 2012-02-01 DIAGNOSIS — I499 Cardiac arrhythmia, unspecified: Secondary | ICD-10-CM | POA: Diagnosis not present

## 2012-02-01 DIAGNOSIS — N289 Disorder of kidney and ureter, unspecified: Secondary | ICD-10-CM | POA: Diagnosis not present

## 2012-02-01 DIAGNOSIS — F3289 Other specified depressive episodes: Secondary | ICD-10-CM | POA: Diagnosis not present

## 2012-03-02 DIAGNOSIS — F411 Generalized anxiety disorder: Secondary | ICD-10-CM | POA: Diagnosis not present

## 2012-03-02 DIAGNOSIS — F339 Major depressive disorder, recurrent, unspecified: Secondary | ICD-10-CM | POA: Diagnosis not present

## 2012-03-08 DIAGNOSIS — R141 Gas pain: Secondary | ICD-10-CM | POA: Diagnosis not present

## 2012-03-08 DIAGNOSIS — R634 Abnormal weight loss: Secondary | ICD-10-CM | POA: Diagnosis not present

## 2012-03-08 DIAGNOSIS — Z1211 Encounter for screening for malignant neoplasm of colon: Secondary | ICD-10-CM | POA: Diagnosis not present

## 2012-03-08 DIAGNOSIS — K5900 Constipation, unspecified: Secondary | ICD-10-CM | POA: Diagnosis not present

## 2012-03-08 DIAGNOSIS — R1033 Periumbilical pain: Secondary | ICD-10-CM | POA: Diagnosis not present

## 2012-03-08 DIAGNOSIS — R198 Other specified symptoms and signs involving the digestive system and abdomen: Secondary | ICD-10-CM | POA: Diagnosis not present

## 2012-03-08 DIAGNOSIS — R143 Flatulence: Secondary | ICD-10-CM | POA: Diagnosis not present

## 2012-03-16 DIAGNOSIS — R1013 Epigastric pain: Secondary | ICD-10-CM | POA: Diagnosis not present

## 2012-03-16 DIAGNOSIS — K2980 Duodenitis without bleeding: Secondary | ICD-10-CM | POA: Diagnosis not present

## 2012-03-16 DIAGNOSIS — R198 Other specified symptoms and signs involving the digestive system and abdomen: Secondary | ICD-10-CM | POA: Diagnosis not present

## 2012-03-16 DIAGNOSIS — D126 Benign neoplasm of colon, unspecified: Secondary | ICD-10-CM | POA: Diagnosis not present

## 2012-03-21 ENCOUNTER — Emergency Department (HOSPITAL_BASED_OUTPATIENT_CLINIC_OR_DEPARTMENT_OTHER): Payer: 59

## 2012-03-21 ENCOUNTER — Encounter (HOSPITAL_BASED_OUTPATIENT_CLINIC_OR_DEPARTMENT_OTHER): Payer: Self-pay | Admitting: *Deleted

## 2012-03-21 ENCOUNTER — Emergency Department (HOSPITAL_BASED_OUTPATIENT_CLINIC_OR_DEPARTMENT_OTHER)
Admission: EM | Admit: 2012-03-21 | Discharge: 2012-03-21 | Disposition: A | Discharge status: Home / Self Care | Payer: 59 | Attending: Emergency Medicine | Admitting: Emergency Medicine

## 2012-03-21 DIAGNOSIS — J45909 Unspecified asthma, uncomplicated: Secondary | ICD-10-CM | POA: Diagnosis not present

## 2012-03-21 DIAGNOSIS — Z79899 Other long term (current) drug therapy: Secondary | ICD-10-CM | POA: Insufficient documentation

## 2012-03-21 DIAGNOSIS — L03119 Cellulitis of unspecified part of limb: Secondary | ICD-10-CM | POA: Diagnosis not present

## 2012-03-21 DIAGNOSIS — Z9089 Acquired absence of other organs: Secondary | ICD-10-CM | POA: Diagnosis not present

## 2012-03-21 DIAGNOSIS — L02419 Cutaneous abscess of limb, unspecified: Secondary | ICD-10-CM | POA: Diagnosis not present

## 2012-03-21 HISTORY — DX: Gastro-esophageal reflux disease without esophagitis: K21.9

## 2012-03-21 LAB — CBC
HCT: 36 % — ABNORMAL LOW (ref 39.0–52.0)
MCH: 30.6 pg (ref 26.0–34.0)
MCV: 90.2 fL (ref 78.0–100.0)
Platelets: 151 10*3/uL (ref 150–400)
RDW: 13.2 % (ref 11.5–15.5)
WBC: 5.3 10*3/uL (ref 4.0–10.5)

## 2012-03-21 LAB — BASIC METABOLIC PANEL
BUN: 11 mg/dL (ref 6–23)
Calcium: 9.5 mg/dL (ref 8.4–10.5)
Chloride: 105 mEq/L (ref 96–112)
Creatinine, Ser: 1 mg/dL (ref 0.50–1.35)
GFR calc Af Amer: >90 mL/min (ref >90)

## 2012-03-21 MED ORDER — CLINDAMYCIN HCL 300 MG PO CAPS: 300.0000 mg | ORAL_CAPSULE | Freq: Four times a day (QID) | ORAL | Status: DC

## 2012-03-21 MED ORDER — CLINDAMYCIN HCL 150 MG PO CAPS
300.0000 mg | ORAL_CAPSULE | Freq: Once | ORAL | Status: AC
  Administered 2012-03-21: 300 mg via ORAL
  Filled 2012-03-21: qty 2

## 2012-03-21 NOTE — ED Provider Notes (Signed)
History   This chart was scribed for David Chick, MD by Sofie Rower. The patient was seen in room MH12/MH12 and the patient's care was started at 7:56PM    CSN: [id number]  Arrival date & time 03/21/12  4098   First MD Initiated Contact with Patient 03/21/12 1956      Chief Complaint  Patient presents with  . Leg Swelling    (Consider location/radiation/quality/duration/timing/severity/associated sxs/prior treatment) The history is provided by the patient. No language interpreter was used.    Adyn Serna is a 61 y.o. male who presents to the Emergency Department complaining of sudden, progressively worsening, leg swelling located bilaterally at the lower extremities, onset yesterday, with associated symptoms of chills. The pt reports he is experiencing a stinging and itching sensation located at both of his lower extremities. The pt has a hx of endoscopy and colonoscopy procedure which was performed last Wednesday, 03/16/12, cholecystectomy, and back surgery.  Left leg has more swelling than right.  No chest pain or shortness of breath.  There are no other associated systemic symptoms, there are no other alleviating or modifying factors.   The pt does not smoke or drink alcohol.     Past Medical History  Diagnosis Date  . Depression   . Asthma   . GERD (gastroesophageal reflux disease)     Past Surgical History  Procedure Date  . Cholecystectomy   . Back surgery     Family History  Problem Relation Age of Onset  . Cancer Mother     breast    History  Substance Use Topics  . Smoking status: Never Smoker   . Smokeless tobacco: Not on file  . Alcohol Use: No      Review of Systems  All other systems reviewed and are negative.    Allergies  Review of patient's allergies indicates no known allergies.  Home Medications   Current Outpatient Rx  Name Route Sig Dispense Refill  . DESVENLAFAXINE SUCCINATE ER 50 MG PO TB24 Oral Take 50 mg by mouth daily.    Marland Kitchen  GABAPENTIN 300 MG PO CAPS Oral Take 200 mg by mouth 2 (two) times daily.    Marland Kitchen OMEPRAZOLE 40 MG PO CPDR Oral Take 40 mg by mouth daily.    Marland Kitchen ROPINIROLE HCL 3 MG PO TABS Oral Take 3 mg by mouth at bedtime.    Marland Kitchen CLINDAMYCIN HCL 300 MG PO CAPS Oral Take 1 capsule (300 mg total) by mouth every 6 (six) hours. 28 capsule 0  . IBUPROFEN 800 MG PO TABS Oral Take 800 mg by mouth every 8 (eight) hours as needed. Patient used this medication pain.      BP 149/87  Pulse 77  Temp 99 F (37.2 C) (Oral)  Resp 16  Ht 6' (1.829 m)  Wt 192 lb (87.091 kg)  BMI 26.04 kg/m2  SpO2 100%  Physical Exam  Nursing note and vitals reviewed. Constitutional: He is oriented to person, place, and time. He appears well-developed and well-nourished.  HENT:  Head: Atraumatic.  Nose: Nose normal.  Eyes: Conjunctivae normal and EOM are normal.  Neck: Normal range of motion.  Cardiovascular: Normal rate, regular rhythm and normal heart sounds.   Pulmonary/Chest: Effort normal and breath sounds normal.  Abdominal: Soft.  Musculoskeletal: Normal range of motion. He exhibits edema.       Bilateral lower extremity edema : Left greater than right. LLE: erythema from dorsum of foot up to below the knee.   Neurological:  He is alert and oriented to person, place, and time.  Skin: Skin is warm and dry. There is erythema.       Scattered small erythematous papules/ulcerations over the anterior lower leg. Positive warmth over the left lower leg.   Psychiatric: He has a normal mood and affect. His behavior is normal.    ED Course  Procedures (including critical care time)  DIAGNOSTIC STUDIES: Oxygen Saturation is 100% on room air, normal by my interpretation.    COORDINATION OF CARE:    8:44PM- Treatment plan discussed with patient. Pt agrees with treatment.   Results for orders placed during the hospital encounter of 03/21/12  CBC      Component Value Range   WBC 5.3  4.0 - 10.5 K/uL   RBC 3.99 (*) 4.22 - 5.81  MIL/uL   Hemoglobin 12.2 (*) 13.0 - 17.0 g/dL   HCT 16.1 (*) 09.6 - 04.5 %   MCV 90.2  78.0 - 100.0 fL   MCH 30.6  26.0 - 34.0 pg   MCHC 33.9  30.0 - 36.0 g/dL   RDW 40.9  81.1 - 91.4 %   Platelets 151  150 - 400 K/uL  BASIC METABOLIC PANEL      Component Value Range   Sodium 141  135 - 145 mEq/L   Potassium 3.7  3.5 - 5.1 mEq/L   Chloride 105  96 - 112 mEq/L   CO2 27  19 - 32 mEq/L   Glucose, Bld 88  70 - 99 mg/dL   BUN 11  6 - 23 mg/dL   Creatinine, Ser 7.82  0.50 - 1.35 mg/dL   Calcium 9.5  8.4 - 95.6 mg/dL   GFR calc non Af Amer 79 (*) >90 mL/min   GFR calc Af Amer >90  >90 mL/min      1. Lower extremity cellulitis       MDM  Pt presents with c/o leg swelling- worse on left leg with associated redness and warmth overlying left leg.  DVT study negative.  Pt started on clindamycin for left lower leg cellulitis.  He is overall nontoxic and well hydrated in appearance.  Discharged with strict return precautions.  Pt agreeable with plan.    I personally performed the services described in this documentation, which was scribed in my presence. The recorded information has been reviewed and considered.    David Chick, MD 03/22/12 [phone]

## 2012-03-21 NOTE — ED Notes (Signed)
Pt c/o lower extr swelling x 1 day

## 2012-03-21 NOTE — ED Notes (Signed)
MD at bedside. 

## 2012-03-23 DIAGNOSIS — D3500 Benign neoplasm of unspecified adrenal gland: Secondary | ICD-10-CM | POA: Diagnosis not present

## 2012-03-25 DIAGNOSIS — D3500 Benign neoplasm of unspecified adrenal gland: Secondary | ICD-10-CM | POA: Diagnosis not present

## 2012-03-30 DIAGNOSIS — F411 Generalized anxiety disorder: Secondary | ICD-10-CM | POA: Diagnosis not present

## 2012-03-30 DIAGNOSIS — F339 Major depressive disorder, recurrent, unspecified: Secondary | ICD-10-CM | POA: Diagnosis not present

## 2012-05-09 ENCOUNTER — Emergency Department (HOSPITAL_BASED_OUTPATIENT_CLINIC_OR_DEPARTMENT_OTHER): Payer: 59

## 2012-05-09 ENCOUNTER — Encounter (HOSPITAL_COMMUNITY): Payer: Self-pay | Admitting: *Deleted

## 2012-05-09 ENCOUNTER — Encounter (HOSPITAL_BASED_OUTPATIENT_CLINIC_OR_DEPARTMENT_OTHER): Payer: Self-pay | Admitting: *Deleted

## 2012-05-09 ENCOUNTER — Emergency Department (HOSPITAL_BASED_OUTPATIENT_CLINIC_OR_DEPARTMENT_OTHER)
Admission: EM | Admit: 2012-05-09 | Discharge: 2012-05-09 | Disposition: A | Discharge status: Home / Self Care | Payer: 59 | Source: Home / Self Care | Attending: Emergency Medicine | Admitting: Emergency Medicine

## 2012-05-09 ENCOUNTER — Telehealth: Payer: Self-pay | Admitting: Family Medicine

## 2012-05-09 ENCOUNTER — Inpatient Hospital Stay (HOSPITAL_BASED_OUTPATIENT_CLINIC_OR_DEPARTMENT_OTHER)
Admission: AD | Admit: 2012-05-09 | Discharge: 2012-05-14 | DRG: 392 | Disposition: A | Discharge status: Home / Self Care | Payer: 59 | Source: Ambulatory Visit | Attending: Internal Medicine | Admitting: Internal Medicine

## 2012-05-09 DIAGNOSIS — K3184 Gastroparesis: Secondary | ICD-10-CM | POA: Diagnosis present

## 2012-05-09 DIAGNOSIS — K219 Gastro-esophageal reflux disease without esophagitis: Secondary | ICD-10-CM | POA: Diagnosis not present

## 2012-05-09 DIAGNOSIS — K311 Adult hypertrophic pyloric stenosis: Secondary | ICD-10-CM | POA: Diagnosis not present

## 2012-05-09 DIAGNOSIS — G8929 Other chronic pain: Secondary | ICD-10-CM | POA: Diagnosis not present

## 2012-05-09 DIAGNOSIS — F329 Major depressive disorder, single episode, unspecified: Secondary | ICD-10-CM

## 2012-05-09 DIAGNOSIS — G2581 Restless legs syndrome: Secondary | ICD-10-CM | POA: Diagnosis present

## 2012-05-09 DIAGNOSIS — R112 Nausea with vomiting, unspecified: Secondary | ICD-10-CM

## 2012-05-09 DIAGNOSIS — G5790 Unspecified mononeuropathy of unspecified lower limb: Secondary | ICD-10-CM | POA: Diagnosis present

## 2012-05-09 DIAGNOSIS — K3189 Other diseases of stomach and duodenum: Secondary | ICD-10-CM | POA: Diagnosis present

## 2012-05-09 DIAGNOSIS — R1110 Vomiting, unspecified: Secondary | ICD-10-CM

## 2012-05-09 DIAGNOSIS — K2990 Gastroduodenitis, unspecified, without bleeding: Secondary | ICD-10-CM | POA: Diagnosis present

## 2012-05-09 DIAGNOSIS — F32A Depression, unspecified: Secondary | ICD-10-CM | POA: Diagnosis present

## 2012-05-09 DIAGNOSIS — K269 Duodenal ulcer, unspecified as acute or chronic, without hemorrhage or perforation: Secondary | ICD-10-CM | POA: Diagnosis not present

## 2012-05-09 DIAGNOSIS — R109 Unspecified abdominal pain: Secondary | ICD-10-CM | POA: Diagnosis not present

## 2012-05-09 DIAGNOSIS — K2970 Gastritis, unspecified, without bleeding: Secondary | ICD-10-CM | POA: Diagnosis present

## 2012-05-09 DIAGNOSIS — R1033 Periumbilical pain: Secondary | ICD-10-CM | POA: Diagnosis not present

## 2012-05-09 DIAGNOSIS — F3289 Other specified depressive episodes: Secondary | ICD-10-CM | POA: Diagnosis not present

## 2012-05-09 HISTORY — DX: Duodenal ulcer, unspecified as acute or chronic, without hemorrhage or perforation: K26.9

## 2012-05-09 HISTORY — DX: Restless legs syndrome: G25.81

## 2012-05-09 HISTORY — DX: Polyneuropathy, unspecified: G62.9

## 2012-05-09 LAB — CBC WITH DIFFERENTIAL/PLATELET
Basophils Absolute: 0 10*3/uL (ref 0.0–0.1)
Eosinophils Relative: 1 % (ref 0–5)
HCT: 44 % (ref 39.0–52.0)
Lymphocytes Relative: 14 % (ref 12–46)
Lymphs Abs: 1.3 10*3/uL (ref 0.7–4.0)
MCV: 89.8 fL (ref 78.0–100.0)
Monocytes Absolute: 0.5 10*3/uL (ref 0.1–1.0)
RDW: 13.7 % (ref 11.5–15.5)
WBC: 8.9 10*3/uL (ref 4.0–10.5)

## 2012-05-09 LAB — COMPREHENSIVE METABOLIC PANEL
CO2: 21 mEq/L (ref 19–32)
Calcium: 10.7 mg/dL — ABNORMAL HIGH (ref 8.4–10.5)
Creatinine, Ser: 1 mg/dL (ref 0.50–1.35)
GFR calc Af Amer: >90 mL/min (ref >90)
GFR calc non Af Amer: 79 mL/min — ABNORMAL LOW (ref >90)
Glucose, Bld: 160 mg/dL — ABNORMAL HIGH (ref 70–99)

## 2012-05-09 LAB — URINALYSIS, ROUTINE W REFLEX MICROSCOPIC
Leukocytes, UA: NEGATIVE
Protein, ur: NEGATIVE mg/dL
Urobilinogen, UA: 0.2 mg/dL (ref 0.0–1.0)

## 2012-05-09 LAB — URINE MICROSCOPIC-ADD ON

## 2012-05-09 LAB — LIPASE, BLOOD: Lipase: 50 U/L (ref 11–59)

## 2012-05-09 MED ORDER — IOHEXOL 300 MG/ML  SOLN
50.0000 mL | Freq: Once | INTRAMUSCULAR | Status: AC | PRN
  Administered 2012-05-09: 50 mL via ORAL

## 2012-05-09 MED ORDER — ENOXAPARIN SODIUM 40 MG/0.4ML ~~LOC~~ SOLN
40.0000 mg | SUBCUTANEOUS | Status: DC
  Administered 2012-05-10 – 2012-05-13 (×5): 40 mg via SUBCUTANEOUS
  Filled 2012-05-09 (×6): qty 0.4

## 2012-05-09 MED ORDER — MORPHINE SULFATE 4 MG/ML IJ SOLN
4.0000 mg | Freq: Once | INTRAMUSCULAR | Status: AC
  Administered 2012-05-09: 4 mg via INTRAVENOUS
  Filled 2012-05-09: qty 1

## 2012-05-09 MED ORDER — OXYCODONE-ACETAMINOPHEN 5-325 MG PO TABS: 1.0000 | ORAL_TABLET | Freq: Four times a day (QID) | ORAL | Status: DC | PRN

## 2012-05-09 MED ORDER — MORPHINE SULFATE 2 MG/ML IJ SOLN
2.0000 mg | INTRAMUSCULAR | Status: DC | PRN
  Administered 2012-05-10 – 2012-05-13 (×5): 2 mg via INTRAVENOUS
  Filled 2012-05-09 (×5): qty 1

## 2012-05-09 MED ORDER — FENTANYL CITRATE 0.05 MG/ML IJ SOLN
100.0000 ug | Freq: Once | INTRAMUSCULAR | Status: AC
  Administered 2012-05-09: 100 ug via INTRAVENOUS
  Filled 2012-05-09: qty 2

## 2012-05-09 MED ORDER — SODIUM CHLORIDE 0.9 % IV BOLUS (SEPSIS)
1000.0000 mL | Freq: Once | INTRAVENOUS | Status: AC
  Administered 2012-05-09: 1000 mL via INTRAVENOUS

## 2012-05-09 MED ORDER — ONDANSETRON HCL 4 MG/2ML IJ SOLN
4.0000 mg | Freq: Once | INTRAMUSCULAR | Status: AC
  Administered 2012-05-09: 4 mg via INTRAVENOUS
  Filled 2012-05-09: qty 2

## 2012-05-09 MED ORDER — IOHEXOL 300 MG/ML  SOLN
80.0000 mL | Freq: Once | INTRAMUSCULAR | Status: AC | PRN
  Administered 2012-05-09: 80 mL via INTRAVENOUS

## 2012-05-09 MED ORDER — SODIUM CHLORIDE 0.9 % IV SOLN
INTRAVENOUS | Status: DC
  Administered 2012-05-10 (×2): via INTRAVENOUS
  Administered 2012-05-11 – 2012-05-12 (×2): 100 mL/h via INTRAVENOUS
  Administered 2012-05-13: 03:00:00 via INTRAVENOUS
  Administered 2012-05-13: 100 mL/h via INTRAVENOUS

## 2012-05-09 MED ORDER — OXYCODONE-ACETAMINOPHEN 5-325 MG PO TABS
2.0000 | ORAL_TABLET | Freq: Once | ORAL | Status: AC
  Administered 2012-05-09: 2 via ORAL
  Filled 2012-05-09 (×2): qty 2

## 2012-05-09 MED ORDER — PROMETHAZINE HCL 25 MG RE SUPP: 25.0000 mg | Freq: Four times a day (QID) | RECTAL | Status: DC | PRN

## 2012-05-09 NOTE — ED Notes (Signed)
Transported to CT 

## 2012-05-09 NOTE — Progress Notes (Signed)
Patient's wife requested that IV therapy place his IV due to previous hard sticks.

## 2012-05-09 NOTE — Telephone Encounter (Signed)
Telephone Note/Direct Admit request  Requesting physician: Arty Baumgartner, MD Time of request: [id number]  HPI: 61 year old man chronic abdominal pain, constipation presented to ED at [facility] this AM for nausea/vomiting/abdominal pain. Discharged from ED to follow-up with [name] same day. Seen in office today by [name], reported to be stable, but unable to eat/drink. Direct admission requested for acute/chronic abdominal pain.  Per [name] had unremarkable upper/lower endoscopy 03/2012  Reported Vitals at time of conversation: Normal per [name] in office today  Reported exam: Unremarkable per [name]  Data: CMP, lipase, CBC, U/A unremarkable in ED this AM CXR in ED this AM--distended stomach CT ab/pelvis in ED this AM--distend stomach, no contrast past proximal duodenum.  Reported impression: 1. Acute/chronic abdominal pain, etiology unclear 2.   Persistant N/V 3.   Distended stomach without evidence of obstruction  Plan based on telephone conversation: 1. Direct admit medical bed 2.   Dr. Elnoria Howard to see in AM ([name] will arrange) 3.   Hydrate, symptomatic treatment. 4.   Further evaluation as clinically indicated  Brendia Sacks, MD [facility] 05/09/2012, 5:03 PM

## 2012-05-09 NOTE — ED Notes (Signed)
Returned from CT.

## 2012-05-09 NOTE — H&P (Signed)
[facility] History and Physical  David Solis ZOX:[id number] DOB: 04-Nov-1950 DOA: 05/09/2012  Referring physician: [name] PCP: [name], FNP   Chief Complaint:  Abdominal pain since 3 days   nausea and projectile vomiting since 1 day  HPI:  61 year old male with history of depression, GERD, peripheral neuropathy, or cystic syndrome who was admitted at [facility] in August for abdominal pain and vomiting with EGD done by [name] in September-October showing 3 duodenal ulcers and placed on PPI. Biopsy was negative for H. Pylori. She is doing all sorts were likely because of over-the-counter Aleve he was taking for off or jaw pain at that time. Patient was stable for sometime after the EGD however was having off and on epigastric pain. 3 days back yesterday and started having worsening epigastric pain without any radiation of symptoms. The pain was all aching in nature without any aggravating or relieving factors. He did not have any fever or chills. He did have 2-3 episodes of watery diarrhea during that time. This morning he started having severe nausea with several episodes of projectile vomiting contained food particles only. He came to the ED where he had workup done including labs which was unremarkable. Patient was discharged home with follow up with [name] today. When the command evaluated him in the clinic he was unable to keep anything down and continued to have projectile vomiting with epigastric pain and was sent back to the hospital. A CT scan of the abdomen and pelvis done showed abdominal distention with the contrast unable to pass beyond proximal duodenum. [facility] called for admission to medical floor. -On my evaluation patient informs his abdominal pain to have been better as he received morphine, fentanyl in the ED. He also received several doses of Zofran his nausea is better at this time. He has not had vomiting since last 8 hours. He  denies taking Aleve since he was found to have an ulcer. Denies any hematemesis , chest pain, palpitations, shortness of breath, headache, blurry vision, fever, chills, bowel or urinary symptoms.  Review of Systems:  Constitutional: Denies fever, chills, diaphoresis, appetite change and fatigue.  HEENT: Denies photophobia, eye pain, redness, hearing loss, ear pain, congestion, sore throat, rhinorrhea, sneezing, mouth sores, trouble swallowing, neck pain, neck stiffness and tinnitus.   Respiratory: Denies SOB, DOE, cough, chest tightness,  and wheezing.   Cardiovascular: Denies chest pain, palpitations and leg swelling.  Gastrointestinal:  nausea, vomiting, abdominal pain, diarrhea, Denies constipation, blood in stool and abdominal distention.  Genitourinary: Denies dysuria, urgency, frequency, hematuria, flank pain and difficulty urinating.  Musculoskeletal: Denies myalgias, back pain, joint swelling, arthralgias and gait problem.  has chronic neuropathy Skin: Denies pallor, rash and wound.  Neurological: Denies dizziness, seizures, syncope, weakness, light-headedness, numbness and headaches.  Hematological: Denies adenopathy. Easy bruising, personal or family bleeding history  Psychiatric/Behavioral: Denies suicidal ideation, mood changes, confusion, nervousness, sleep disturbance and agitation   Past Medical History  Diagnosis Date  . Depression   . Asthma   . GERD (gastroesophageal reflux disease)   . Duodenal ulcer disease   . Restless leg   . Neuropathy     in legs   Past Surgical History  Procedure Date  . Cholecystectomy   . Back surgery   . Cervical fusion    Social History:  reports that he quit smoking about 18 years ago. He has never used smokeless tobacco. He reports that he does not drink alcohol or use illicit drugs.  No Known Allergies  Family History  Problem Relation Age of Onset  . Cancer Mother     breast    Prior to Admission medications   Medication  Sig Start Date End Date Taking? Authorizing Provider  desvenlafaxine (PRISTIQ) 50 MG 24 hr tablet Take 50 mg by mouth daily.   Yes Historical Provider, MD  gabapentin (NEURONTIN) 300 MG capsule Take 300 mg by mouth 3 (three) times daily.    Yes Historical Provider, MD  omeprazole (PRILOSEC) 40 MG capsule Take 40 mg by mouth daily.   Yes Historical Provider, MD  oxyCODONE-acetaminophen (PERCOCET) 5-325 MG per tablet Take 1 tablet by mouth every 6 (six) hours as needed for pain. 05/09/12  Yes April K Palumbo-Rasch, MD  promethazine (PHENERGAN) 25 MG suppository Place 1 suppository (25 mg total) rectally every 6 (six) hours as needed for nausea. 05/09/12  Yes April K Palumbo-Rasch, MD  rOPINIRole (REQUIP) 3 MG tablet Take 3 mg by mouth at bedtime.   Yes Historical Provider, MD  tetrahydrozoline-zinc (VISINE-AC) 0.05-0.25 % ophthalmic solution Place 2 drops into both eyes 3 (three) times daily as needed.   Yes Historical Provider, MD    Physical Exam:  Filed Vitals:   05/09/12 1721 05/09/12 1725  BP: 144/79   Pulse: 62   Temp: 98.1 F (36.7 C)   TempSrc: Oral   Resp: 18   Height:  5\' 11"  (1.803 m)  Weight:  87.8 kg (193 lb 9 oz)  SpO2: 100%     Constitutional: Vital signs reviewed.  Patient is a well-developed and well-nourished in no acute distress and cooperative with exam. Alert and oriented x3. Flat affect Head: Normocephalic and atraumatic Ear: TM normal bilaterally Mouth: no erythema or exudates, MMM Eyes: PERRL, EOMI, conjunctivae normal, No scleral icterus.  Neck: Supple, Trachea midline normal ROM, No JVD, mass, thyromegaly, or carotid bruit present.  Cardiovascular: RRR, S1 normal, S2 normal, no MRG, pulses symmetric and intact bilaterally Pulmonary/Chest: CTAB, no wheezes, rales, or rhonchi Abdominal: Soft. Tender to palpation over epigastric and right upper quadrant area, non-distended, bowel sounds are normal, no masses, organomegaly, or guarding present.  GU: no CVA  tenderness Musculoskeletal: No joint deformities, erythema, or stiffness, ROM full and no nontender Ext: no edema and no cyanosis, pulses palpable bilaterally (DP and PT) Hematology: no cervical, inginal, or axillary adenopathy.  Neurological: A&O x3, Strenght is normal and symmetric bilaterally, cranial nerve II-XII are grossly intact, no focal motor deficit, sensory intact to light touch bilaterally.  Skin: Warm, dry and intact. No rash, cyanosis, or clubbing.  Psychiatric: Normal mood but Has flat affect. speech and behavior is normal. Judgment and thought content normal. Cognition and memory are normal.   Labs on Admission:  Basic Metabolic Panel:  Lab 05/09/12 1610  NA 139  K 4.5  CL 103  CO2 21  GLUCOSE 160*  BUN 22  CREATININE 1.00  CALCIUM 10.7*  MG --  PHOS --   Liver Function Tests:  Lab 05/09/12 0441  AST 21  ALT 13  ALKPHOS 73  BILITOT 0.6  PROT 7.8  ALBUMIN 4.3    Lab 05/09/12 0441  LIPASE 50  AMYLASE --   No results found for this basename: AMMONIA:5 in the last 168 hours CBC:  Lab 05/09/12 0441  WBC 8.9  NEUTROABS 7.0  HGB 15.0  HCT 44.0  MCV 89.8  PLT 169   Cardiac Enzymes: No results found for this basename: CKTOTAL:5,CKMB:5,CKMBINDEX:5,TROPONINI:5 in the last 168 hours BNP: No components  found with this basename: POCBNP:5 CBG: No results found for this basename: GLUCAP:5 in the last 168 hours  Radiological Exams on Admission: Dg Chest 1 View  05/09/2012  *RADIOLOGY REPORT*  Clinical Data: Mid to upper abdominal pain; nausea and vomiting.  CHEST - 1 VIEW  Comparison: Chest radiograph performed 07/24/2003  Findings: The lungs are well-aerated.  Chronic peribronchial thickening is noted.  There is no evidence of focal opacification, pleural effusion or pneumothorax.  The cardiomediastinal silhouette is within normal limits.  No acute osseous abnormalities are seen.  The stomach is distended with fluid and air.  Clips are noted within the right  upper quadrant, reflecting prior cholecystectomy.  IMPRESSION:  1.  No acute cardiopulmonary process seen; chronic peribronchial thickening noted. 2.  Stomach distended with fluid and air.   Original Report Authenticated By: Tonia Ghent, M.D.    Ct Abdomen Pelvis W Contrast  05/09/2012  *RADIOLOGY REPORT*  Clinical Data: Upper mid abdominal pain; history of duodenal ulcers.  Diarrhea, nausea and vomiting.  CT ABDOMEN AND PELVIS WITH CONTRAST  Technique:  Multidetector CT imaging of the abdomen and pelvis was performed following the standard protocol during bolus administration of intravenous contrast.  Contrast:  80 mL of Omnipaque 300 IV contrast  Comparison: CT of the abdomen and pelvis performed 09/30/2011  Findings: The visualized lung bases are clear.  Focal coronary artery calcification is noted.  Scattered small hypodensities within the liver are nonspecific but likely reflect small cysts.  Mild prominence of the intrahepatic biliary ducts likely reflects recent cholecystectomy.  Clips are noted at the gallbladder fossa.  The pancreas and adrenal glands are unremarkable in appearance.  A 1.0 cm isodense lesion is noted at the interpole region of the right kidney; this may have increased slightly in size from the prior CT.  As before, MRI with and without contrast would be helpful to exclude malignancy.  There is no evidence of hydronephrosis.  No renal or ureteral stones are seen.  No perinephric stranding is appreciated.  No free fluid is identified.  The small bowel is unremarkable in appearance.  The stomach is distended with contrast and air; contrast does not appear to have progressed beyond the proximal duodenum.  No acute vascular abnormalities are seen.  The appendix is normal in caliber, without evidence for appendicitis.  The colon is unremarkable in appearance.  The bladder is mildly distended and grossly unremarkable. Scattered calcification is noted within the prostate.  No inguinal  lymphadenopathy is seen.  No acute osseous abnormalities are identified.  Vacuum phenomenon and mild disc space narrowing are seen at L5-S1.  IMPRESSION:  1.  Distension of the stomach with contrast and air; contrast does not appear to have extended beyond the proximal duodenum.  Small bowel is grossly unremarkable in appearance. 2.  Focal coronary artery calcifications noted. 3.  Likely small hepatic cysts seen. 4.  1.0 cm isodense lesion at the interpole region of the right kidney; this may have increased slightly in size from the prior CT. As before, MRI with and without contrast would be helpful to exclude malignancy.   Original Report Authenticated By: Tonia Ghent, M.D.       Assessment/Plan Principal Problem:  *Gastric outlet obstruction Likely related to underlying duodenal ulcer with possible stricture. Patient admitted to MedSurg. He has not had any vomiting for past 8 hours. However does have off and on epigastric and right upper quadrant pain. CT abdomen shows absence of contrast beyond the proximal duodenum. -I. will  place him on a PPI drip. -When necessary morphine for pain. When necessary Zofran for nausea and vomiting. He does not need an NG tube at this time and will be considered if continues to have vomiting. Will check a lactate level. -Patient will be seen by Dr. hung in the morning for further evaluation. -Check CBC and be made in the morning.   Active Problems:  Right renal lesion CT abdomen shows a 1 cm isodense lesion at the interpole region of the right kidney which seems to have increased in size from prior CT and recommend MRI with and without contrast to exclude malignancy. This can be done him for acute issue has resolved either as inpatient or outpatient.   Duodenal ulcer Continue with PPI drip   Restless leg syndrome On the tip at home which will be filled as patient is n.p.o.   Depression Holding home medications given n.p.o. Status.    Neuropathy of  lower extremity On Neurontin at home which is held as patient is n.p.o.  DVT prophylaxis: Subcutaneous Lovenox  Code Status: Full code Family Communication: Wife at bedside  Disposition Plan: Home once stable [facility] Pager [phone]  If 7PM-7AM, please contact night-coverage [email or web address] Password [facility] 05/09/2012, 7:14 PM   Total time spent: 70 minutes

## 2012-05-09 NOTE — ED Notes (Addendum)
C/o upper mid abdominal pain since Oct. States known hx of duodenal ulcers. States abd pain getting worse per pt over past 24 hours with nausea/vomiting. C/o diarrhea since Friday. Denies any fevers. Denies any urinary symptoms. C/o dark stools. Denies any cp/sob.

## 2012-05-09 NOTE — ED Provider Notes (Signed)
History     CSN: [id number]  Arrival date & time 05/09/12  4098   First MD Initiated Contact with Patient 05/09/12 0356      Chief Complaint  Patient presents with  . Abdominal Pain    (Consider location/radiation/quality/duration/timing/severity/associated sxs/prior treatment) Patient is a 61 y.o. male presenting with abdominal pain. The history is provided by the patient and the spouse.  Abdominal Pain The primary symptoms of the illness include abdominal pain, nausea and vomiting. The primary symptoms of the illness do not include fever or diarrhea. The current episode started more than 2 days ago (several month with acute exacerbation for 1 day). The onset of the illness was gradual. The problem has been rapidly worsening.  The abdominal pain began more than 2 days ago. The pain came on gradually. The abdominal pain has been rapidly worsening since its onset. The abdominal pain is located in the periumbilical region. The abdominal pain does not radiate. The severity of the abdominal pain is 8/10. The abdominal pain is relieved by nothing. Exacerbated by: nothing.    Past Medical History  Diagnosis Date  . Depression   . Asthma   . GERD (gastroesophageal reflux disease)   . Duodenal ulcer disease     Past Surgical History  Procedure Date  . Cholecystectomy   . Back surgery     Family History  Problem Relation Age of Onset  . Cancer Mother     breast    History  Substance Use Topics  . Smoking status: Never Smoker   . Smokeless tobacco: Not on file  . Alcohol Use: No      Review of Systems  Constitutional: Negative for fever.  Respiratory: Negative for cough.   Cardiovascular: Negative for chest pain.  Gastrointestinal: Positive for nausea, vomiting and abdominal pain. Negative for diarrhea.  All other systems reviewed and are negative.    Allergies  Review of patient's allergies indicates no known allergies.  Home Medications   Current Outpatient Rx   Name  Route  Sig  Dispense  Refill  . CLINDAMYCIN HCL 300 MG PO CAPS   Oral   Take 1 capsule (300 mg total) by mouth every 6 (six) hours.   28 capsule   0   . DESVENLAFAXINE SUCCINATE ER 50 MG PO TB24   Oral   Take 50 mg by mouth daily.         Marland Kitchen GABAPENTIN 300 MG PO CAPS   Oral   Take 300 mg by mouth 3 (three) times daily.          . IBUPROFEN 800 MG PO TABS   Oral   Take 800 mg by mouth every 8 (eight) hours as needed. Patient used this medication pain.         Marland Kitchen OMEPRAZOLE 40 MG PO CPDR   Oral   Take 40 mg by mouth daily.         Marland Kitchen ROPINIROLE HCL 3 MG PO TABS   Oral   Take 3 mg by mouth at bedtime.           BP 153/94  Pulse 76  Temp 97.8 F (36.6 C) (Oral)  Resp 18  Ht 5\' 10"  (1.778 m)  Wt 196 lb (88.905 kg)  BMI 28.12 kg/m2  SpO2 100%  Physical Exam  Constitutional: He is oriented to person, place, and time. He appears well-developed and well-nourished. No distress.  HENT:  Head: Normocephalic.  Mouth/Throat: Oropharynx is clear and moist.  Eyes: Conjunctivae normal are normal. Pupils are equal, round, and reactive to light.  Neck: Normal range of motion. Neck supple.  Cardiovascular: Normal rate and regular rhythm.   Pulmonary/Chest: Effort normal and breath sounds normal. He has no wheezes. He has no rales.  Abdominal: Soft. Bowel sounds are normal. There is tenderness. There is no rebound and no guarding.       Band at the level of the umbilicus   Musculoskeletal: Normal range of motion.  Neurological: He is alert and oriented to person, place, and time.  Skin: Skin is warm and dry.  Psychiatric: He has a normal mood and affect.    ED Course  Procedures (including critical care time)  Labs Reviewed  URINALYSIS, ROUTINE W REFLEX MICROSCOPIC - Abnormal; Notable for the following:    Hgb urine dipstick SMALL (*)     All other components within normal limits  URINE MICROSCOPIC-ADD ON  CBC WITH DIFFERENTIAL  COMPREHENSIVE METABOLIC PANEL   LIPASE, BLOOD   No results found.   No diagnosis found.    MDM  Case d/w [name] who will call the patient for close follow up.    Return for fevers intractable vomiting pain that localizes to the right lower quadrant inability to tolerate PO rigid abdomen or any concerns.  Patient verbalizes understanding and agrees to follow up        Ichael Pullara Smitty Cords, MD 05/09/12 1610

## 2012-05-10 ENCOUNTER — Encounter (HOSPITAL_COMMUNITY)
Admission: AD | Disposition: A | Discharge status: Home / Self Care | Payer: Self-pay | Source: Ambulatory Visit | Attending: Internal Medicine

## 2012-05-10 ENCOUNTER — Encounter (HOSPITAL_COMMUNITY): Payer: Self-pay | Admitting: *Deleted

## 2012-05-10 DIAGNOSIS — R112 Nausea with vomiting, unspecified: Secondary | ICD-10-CM

## 2012-05-10 DIAGNOSIS — G8929 Other chronic pain: Secondary | ICD-10-CM

## 2012-05-10 DIAGNOSIS — R109 Unspecified abdominal pain: Secondary | ICD-10-CM

## 2012-05-10 HISTORY — PX: ESOPHAGOGASTRODUODENOSCOPY: SHX5428

## 2012-05-10 LAB — BASIC METABOLIC PANEL
BUN: 24 mg/dL — ABNORMAL HIGH (ref 6–23)
CO2: 23 mEq/L (ref 19–32)
Calcium: 9.9 mg/dL (ref 8.4–10.5)
Creatinine, Ser: 1 mg/dL (ref 0.50–1.35)
GFR calc Af Amer: >90 mL/min (ref >90)

## 2012-05-10 LAB — CBC
HCT: 41.3 % (ref 39.0–52.0)
MCHC: 33.4 g/dL (ref 30.0–36.0)
MCV: 92.2 fL (ref 78.0–100.0)
Platelets: 154 10*3/uL (ref 150–400)
RDW: 13.7 % (ref 11.5–15.5)
WBC: 7.9 10*3/uL (ref 4.0–10.5)

## 2012-05-10 SURGERY — EGD (ESOPHAGOGASTRODUODENOSCOPY)
Anesthesia: Moderate Sedation

## 2012-05-10 MED ORDER — MIDAZOLAM HCL 10 MG/2ML IJ SOLN
INTRAMUSCULAR | Status: DC | PRN
  Administered 2012-05-10 (×4): 2 mg via INTRAVENOUS

## 2012-05-10 MED ORDER — SODIUM CHLORIDE 0.9 % IV SOLN
INTRAVENOUS | Status: DC
  Administered 2012-05-10: 500 mL via INTRAVENOUS

## 2012-05-10 MED ORDER — FENTANYL CITRATE 0.05 MG/ML IJ SOLN
INTRAMUSCULAR | Status: DC | PRN
  Administered 2012-05-10 (×3): 25 ug via INTRAVENOUS

## 2012-05-10 MED ORDER — DIPHENHYDRAMINE HCL 50 MG/ML IJ SOLN
INTRAMUSCULAR | Status: DC | PRN
  Administered 2012-05-10: 25 mg via INTRAVENOUS

## 2012-05-10 MED ORDER — ONDANSETRON HCL 4 MG/2ML IJ SOLN
4.0000 mg | INTRAMUSCULAR | Status: DC | PRN
  Administered 2012-05-10 – 2012-05-11 (×4): 4 mg via INTRAVENOUS
  Filled 2012-05-10 (×4): qty 2

## 2012-05-10 MED ORDER — DIPHENHYDRAMINE HCL 50 MG/ML IJ SOLN
INTRAMUSCULAR | Status: AC
  Filled 2012-05-10: qty 1

## 2012-05-10 MED ORDER — FENTANYL CITRATE 0.05 MG/ML IJ SOLN
INTRAMUSCULAR | Status: AC
  Filled 2012-05-10: qty 2

## 2012-05-10 MED ORDER — PANTOPRAZOLE SODIUM 40 MG IV SOLR
40.0000 mg | Freq: Two times a day (BID) | INTRAVENOUS | Status: DC
  Administered 2012-05-10 – 2012-05-14 (×9): 40 mg via INTRAVENOUS
  Filled 2012-05-10 (×10): qty 40

## 2012-05-10 MED ORDER — MIDAZOLAM HCL 10 MG/2ML IJ SOLN
INTRAMUSCULAR | Status: AC
  Filled 2012-05-10: qty 2

## 2012-05-10 MED ORDER — SODIUM CHLORIDE 0.9 % IV SOLN
8.0000 mg/h | INTRAVENOUS | Status: DC
  Filled 2012-05-10: qty 80

## 2012-05-10 NOTE — Progress Notes (Signed)
[facility] PROGRESS NOTE  David Solis MRN:[id number] DOB: 02/09/1951 DOA: 05/09/2012 PCP: ANDERSON,TERESA, FNP GI: J. Mann, MD  Assessment/Plan: 1. Nausea & vomiting--currently resolved, presumed secondary to #2. Further workup per GI. 2. Stomach distension--query gastric outlet obstruction. Dr. Mann suggested hydration ~24 hours, consider MRA abdomen. Patient has no symptoms to suggest mesenteric ischemia. Await Dr. Hung's evaluation. 3. Known duodenal ulcers by history--PPI. Note Protonix infusion was ordered on admission but not administered. Patient without bleeding/hematemesis or pain. BID PPI. 4. Chronic abdominal pain--currently stable. 5. Right kidney lesion--consider MRI to further evaluate, could be deferred to outpatient setting. 6. GERD--PPI as above. 7. Depression--resume Pristiq when able to take PO. 8. RLS--resume Requip when able to take PO.  Code Status: full code Family Communication: none present Disposition Plan: home when improved  David Goodrich, MD  [facility] Team 5 Pager [phone].  If 7PM-7AM, please contact night-coverage at [email or web address], password [email or web address] 05/10/2012, 9:06 AM  LOS: 1 day   Brief narrative: 61-year-old man PMH chronic abdominal pain, constipation presented to ED at [facility] 11/25 AM for nausea/vomiting/abdominal pain. Discharged from ED to follow-up with Dr. Mann same day. Seen in office 11/25 by Dr. Mann, reported to be stable, but unable to eat/drink. Direct admission requested for vomiting, acute/chronic abdominal pain, stomach distension.  Consultants:  [facility]  Procedures:    HPI/Subjective: No vomiting since admission, no abdominal pain, minimal nausea. Has had nothing by mouth. No new complaints. Denies pain with eating in past.  Objective: Filed Vitals:   05/09/12 1721 05/09/12 1725 05/09/12 2158 05/10/12 0652  BP: 144/79  125/73 128/70  Pulse: 62  73 73  Temp: 98.1 F (36.7 C)  97.8 F (36.6 C) 98.4 F  (36.9 C)  TempSrc: Oral  Oral Oral  Resp: 18  18 18  Height:  5' 11" (1.803 m)    Weight:  87.8 kg (193 lb 9 oz)    SpO2: 100%  99% 97%   No intake or output data in the 24 hours ending 05/10/12 0906 Filed Weights   05/09/12 1725  Weight: 87.8 kg (193 lb 9 oz)    Exam:  General:  Appears calm and comfortable Cardiovascular: RRR, no m/r/g.  Respiratory: CTA bilaterally, no w/r/r. Normal respiratory effort. Abdomen: soft, ntnd Psychiatric: grossly normal mood and affect, speech fluent and appropriate  Data Reviewed: Basic Metabolic Panel:  Lab 05/10/12 0500 05/09/12 0441  NA 140 139  K 4.0 4.5  CL 106 103  CO2 23 21  GLUCOSE 96 160*  BUN 24* 22  CREATININE 1.00 1.00  CALCIUM 9.9 10.7*  MG -- --  PHOS -- --   Liver Function Tests:  Lab 05/09/12 0441  AST 21  ALT 13  ALKPHOS 73  BILITOT 0.6  PROT 7.8  ALBUMIN 4.3    Lab 05/09/12 0441  LIPASE 50  AMYLASE --   CBC:  Lab 05/10/12 0500 05/09/12 0441  WBC 7.9 8.9  NEUTROABS -- 7.0  HGB 13.8 15.0  HCT 41.3 44.0  MCV 92.2 89.8  PLT 154 169   Studies: Dg Chest 1 View  05/09/2012  *RADIOLOGY REPORT*  Clinical Data: Mid to upper abdominal pain; nausea and vomiting.  CHEST - 1 VIEW  Comparison: Chest radiograph performed 07/24/2003  Findings: The lungs are well-aerated.  Chronic peribronchial thickening is noted.  There is no evidence of focal opacification, pleural effusion or pneumothorax.  The cardiomediastinal silhouette is within normal limits.  No acute osseous abnormalities are seen.  The stomach   is distended with fluid and air.  Clips are noted within the right upper quadrant, reflecting prior cholecystectomy.  IMPRESSION:  1.  No acute cardiopulmonary process seen; chronic peribronchial thickening noted. 2.  Stomach distended with fluid and air.   Original Report Authenticated By: Jeffrey Chang, M.D.    Ct Abdomen Pelvis W Contrast  05/09/2012  *RADIOLOGY REPORT*  Clinical Data: Upper mid abdominal pain;  history of duodenal ulcers.  Diarrhea, nausea and vomiting.  CT ABDOMEN AND PELVIS WITH CONTRAST  Technique:  Multidetector CT imaging of the abdomen and pelvis was performed following the standard protocol during bolus administration of intravenous contrast.  Contrast:  80 mL of Omnipaque 300 IV contrast  Comparison: CT of the abdomen and pelvis performed 09/30/2011  Findings: The visualized lung bases are clear.  Focal coronary artery calcification is noted.  Scattered small hypodensities within the liver are nonspecific but likely reflect small cysts.  Mild prominence of the intrahepatic biliary ducts likely reflects recent cholecystectomy.  Clips are noted at the gallbladder fossa.  The pancreas and adrenal glands are unremarkable in appearance.  A 1.0 cm isodense lesion is noted at the interpole region of the right kidney; this may have increased slightly in size from the prior CT.  As before, MRI with and without contrast would be helpful to exclude malignancy.  There is no evidence of hydronephrosis.  No renal or ureteral stones are seen.  No perinephric stranding is appreciated.  No free fluid is identified.  The small bowel is unremarkable in appearance.  The stomach is distended with contrast and air; contrast does not appear to have progressed beyond the proximal duodenum.  No acute vascular abnormalities are seen.  The appendix is normal in caliber, without evidence for appendicitis.  The colon is unremarkable in appearance.  The bladder is mildly distended and grossly unremarkable. Scattered calcification is noted within the prostate.  No inguinal lymphadenopathy is seen.  No acute osseous abnormalities are identified.  Vacuum phenomenon and mild disc space narrowing are seen at L5-S1.  IMPRESSION:  1.  Distension of the stomach with contrast and air; contrast does not appear to have extended beyond the proximal duodenum.  Small bowel is grossly unremarkable in appearance. 2.  Focal coronary artery  calcifications noted. 3.  Likely small hepatic cysts seen. 4.  1.0 cm isodense lesion at the interpole region of the right kidney; this may have increased slightly in size from the prior CT. As before, MRI with and without contrast would be helpful to exclude malignancy.   Original Report Authenticated By: Jeffrey Chang, M.D.     Scheduled Meds:   . enoxaparin (LOVENOX) injection  40 mg Subcutaneous Q24H  . pantoprazole (PROTONIX) IV  40 mg Intravenous Q12H   Continuous Infusions:   . sodium chloride 100 mL/hr at 05/10/12 0000  . [DISCONTINUED] pantoprozole (PROTONIX) infusion      Principal Problem:  *Gastric outlet obstruction Active Problems:  Depression  Duodenal ulcer  GERD (gastroesophageal reflux disease)  Neuropathy of lower extremity  Restless leg syndrome  Nausea & vomiting  Chronic abdominal pain     David Goodrich, MD  [facility] Team 5 Pager [phone].  If 7PM-7AM, please contact night-coverage at [email or web address], password [email or web address] 05/10/2012, 9:06 AM  LOS: 1 day   Time spent: 20 minutes      

## 2012-05-10 NOTE — Interval H&P Note (Signed)
History and Physical Interval Note:  05/10/2012 2:45 PM  David Solis  has presented today for surgery, with the diagnosis of ? Gastric Outlet Obstruction  The various methods of treatment have been discussed with the patient and family. After consideration of risks, benefits and other options for treatment, the patient has consented to  Procedure(s) (LRB) with comments: ESOPHAGOGASTRODUODENOSCOPY (EGD) (N/A) as a surgical intervention .  The patient's history has been reviewed, patient examined, no change in status, stable for surgery.  I have reviewed the patient's chart and labs.  Questions were answered to the patient's satisfaction.     [name]

## 2012-05-10 NOTE — H&P (View-Only) (Signed)
[facility] PROGRESS NOTE  David Solis ZOX:[id number] DOB: Apr 25, 1951 DOA: 05/09/2012 PCP: [name], FNP GI: Arty Baumgartner, MD  Assessment/Plan: 1. Nausea & vomiting--currently resolved, presumed secondary to #2. Further workup per GI. 2. Stomach distension--query gastric outlet obstruction. [name] suggested hydration ~24 hours, consider MRA abdomen. Patient has no symptoms to suggest mesenteric ischemia. Await Dr. Haywood Pao evaluation. 3. Known duodenal ulcers by history--PPI. Note Protonix infusion was ordered on admission but not administered. Patient without bleeding/hematemesis or pain. BID PPI. 4. Chronic abdominal pain--currently stable. 5. Right kidney lesion--consider MRI to further evaluate, could be deferred to outpatient setting. 6. GERD--PPI as above. 7. Depression--resume Pristiq when able to take PO. 8. RLS--resume Requip when able to take PO.  Code Status: full code Family Communication: none present Disposition Plan: home when improved  Brendia Sacks, MD  [facility] Team 5 Pager [phone].  If 7PM-7AM, please contact night-coverage at [email or web address], password [facility] 05/10/2012, 9:06 AM  LOS: 1 day   Brief narrative: 61 year old man PMH chronic abdominal pain, constipation presented to ED at [facility] 11/25 AM for nausea/vomiting/abdominal pain. Discharged from ED to follow-up with [name] same day. Seen in office 11/25 by [name], reported to be stable, but unable to eat/drink. Direct admission requested for vomiting, acute/chronic abdominal pain, stomach distension.  Consultants:  [facility]  Procedures:    HPI/Subjective: No vomiting since admission, no abdominal pain, minimal nausea. Has had nothing by mouth. No new complaints. Denies pain with eating in past.  Objective: Filed Vitals:   05/09/12 1721 05/09/12 1725 05/09/12 2158 05/10/12 0652  BP: 144/79  125/73 128/70  Pulse: 62  73 73  Temp: 98.1 F (36.7 C)  97.8 F (36.6 C) 98.4 F  (36.9 C)  TempSrc: Oral  Oral Oral  Resp: 18  18 18   Height:  5\' 11"  (1.803 m)    Weight:  87.8 kg (193 lb 9 oz)    SpO2: 100%  99% 97%   No intake or output data in the 24 hours ending 05/10/12 0906 Filed Weights   05/09/12 1725  Weight: 87.8 kg (193 lb 9 oz)    Exam:  General:  Appears calm and comfortable Cardiovascular: RRR, no m/r/g.  Respiratory: CTA bilaterally, no w/r/r. Normal respiratory effort. Abdomen: soft, ntnd Psychiatric: grossly normal mood and affect, speech fluent and appropriate  Data Reviewed: Basic Metabolic Panel:  Lab 05/10/12 8295 05/09/12 0441  NA 140 139  K 4.0 4.5  CL 106 103  CO2 23 21  GLUCOSE 96 160*  BUN 24* 22  CREATININE 1.00 1.00  CALCIUM 9.9 10.7*  MG -- --  PHOS -- --   Liver Function Tests:  Lab 05/09/12 0441  AST 21  ALT 13  ALKPHOS 73  BILITOT 0.6  PROT 7.8  ALBUMIN 4.3    Lab 05/09/12 0441  LIPASE 50  AMYLASE --   CBC:  Lab 05/10/12 0500 05/09/12 0441  WBC 7.9 8.9  NEUTROABS -- 7.0  HGB 13.8 15.0  HCT 41.3 44.0  MCV 92.2 89.8  PLT 154 169   Studies: Dg Chest 1 View  05/09/2012  *RADIOLOGY REPORT*  Clinical Data: Mid to upper abdominal pain; nausea and vomiting.  CHEST - 1 VIEW  Comparison: Chest radiograph performed 07/24/2003  Findings: The lungs are well-aerated.  Chronic peribronchial thickening is noted.  There is no evidence of focal opacification, pleural effusion or pneumothorax.  The cardiomediastinal silhouette is within normal limits.  No acute osseous abnormalities are seen.  The stomach  is distended with fluid and air.  Clips are noted within the right upper quadrant, reflecting prior cholecystectomy.  IMPRESSION:  1.  No acute cardiopulmonary process seen; chronic peribronchial thickening noted. 2.  Stomach distended with fluid and air.   Original Report Authenticated By: Tonia Ghent, M.D.    Ct Abdomen Pelvis W Contrast  05/09/2012  *RADIOLOGY REPORT*  Clinical Data: Upper mid abdominal pain;  history of duodenal ulcers.  Diarrhea, nausea and vomiting.  CT ABDOMEN AND PELVIS WITH CONTRAST  Technique:  Multidetector CT imaging of the abdomen and pelvis was performed following the standard protocol during bolus administration of intravenous contrast.  Contrast:  80 mL of Omnipaque 300 IV contrast  Comparison: CT of the abdomen and pelvis performed 09/30/2011  Findings: The visualized lung bases are clear.  Focal coronary artery calcification is noted.  Scattered small hypodensities within the liver are nonspecific but likely reflect small cysts.  Mild prominence of the intrahepatic biliary ducts likely reflects recent cholecystectomy.  Clips are noted at the gallbladder fossa.  The pancreas and adrenal glands are unremarkable in appearance.  A 1.0 cm isodense lesion is noted at the interpole region of the right kidney; this may have increased slightly in size from the prior CT.  As before, MRI with and without contrast would be helpful to exclude malignancy.  There is no evidence of hydronephrosis.  No renal or ureteral stones are seen.  No perinephric stranding is appreciated.  No free fluid is identified.  The small bowel is unremarkable in appearance.  The stomach is distended with contrast and air; contrast does not appear to have progressed beyond the proximal duodenum.  No acute vascular abnormalities are seen.  The appendix is normal in caliber, without evidence for appendicitis.  The colon is unremarkable in appearance.  The bladder is mildly distended and grossly unremarkable. Scattered calcification is noted within the prostate.  No inguinal lymphadenopathy is seen.  No acute osseous abnormalities are identified.  Vacuum phenomenon and mild disc space narrowing are seen at L5-S1.  IMPRESSION:  1.  Distension of the stomach with contrast and air; contrast does not appear to have extended beyond the proximal duodenum.  Small bowel is grossly unremarkable in appearance. 2.  Focal coronary artery  calcifications noted. 3.  Likely small hepatic cysts seen. 4.  1.0 cm isodense lesion at the interpole region of the right kidney; this may have increased slightly in size from the prior CT. As before, MRI with and without contrast would be helpful to exclude malignancy.   Original Report Authenticated By: Tonia Ghent, M.D.     Scheduled Meds:   . enoxaparin (LOVENOX) injection  40 mg Subcutaneous Q24H  . pantoprazole (PROTONIX) IV  40 mg Intravenous Q12H   Continuous Infusions:   . sodium chloride 100 mL/hr at 05/10/12 0000  . [DISCONTINUED] pantoprozole (PROTONIX) infusion      Principal Problem:  *Gastric outlet obstruction Active Problems:  Depression  Duodenal ulcer  GERD (gastroesophageal reflux disease)  Neuropathy of lower extremity  Restless leg syndrome  Nausea & vomiting  Chronic abdominal pain     Brendia Sacks, MD  [facility] Team 5 Pager [phone].  If 7PM-7AM, please contact night-coverage at [email or web address], password [facility] 05/10/2012, 9:06 AM  LOS: 1 day   Time spent: 20 minutes

## 2012-05-10 NOTE — Consult Note (Signed)
Reason for Consult: Projectile vomiting and abdominal pain Referring Physician: [facility]  Shlomie Clinton HPI: This is a 61 year old male who is admitted for projectile vomiting and abdominal pain.  His symptoms started in August with abdominal pain and it continued to progress.  He was evaluated by [name] for this pain and weight loss with an EGD/Colonoscopy, which was only revealing for few erosions in the proximal duodenum.  No other abnormalities noted.  The patient was treated with omeprazole and he seemed to be improving/stabilizing, but acutely this past Friday he started to have diarrhea.  It then progressed to the point that he was having projectile vomiting.  A CT scan was performed and it revealed that no contrast traversed beyond the proximal duodenum 1.25 hours after ingestion of contrast, however, there was no overt stricture or mass noted.  He denies having any abdominal pain with PO intake and in the past he never had projectile vomiting.  The gastric lumen also appears to be dilated.  As a result of his symptoms he was admitted for further evaluation.  Past Medical History  Diagnosis Date  . Depression   . Asthma   . GERD (gastroesophageal reflux disease)   . Duodenal ulcer disease   . Restless leg   . Neuropathy     in legs    Past Surgical History  Procedure Date  . Cholecystectomy   . Back surgery   . Cervical fusion     Family History  Problem Relation Age of Onset  . Cancer Mother     breast    Social History:  reports that he quit smoking about 18 years ago. He has never used smokeless tobacco. He reports that he does not drink alcohol or use illicit drugs.  Allergies: No Known Allergies  Medications:  Scheduled:   . enoxaparin (LOVENOX) injection  40 mg Subcutaneous Q24H  . pantoprazole (PROTONIX) IV  40 mg Intravenous Q12H   Continuous:   . sodium chloride 100 mL/hr at 05/10/12 0907  . [DISCONTINUED] pantoprozole (PROTONIX) infusion       Results for orders placed during the hospital encounter of 05/09/12 (from the past 24 hour(s))  LACTIC ACID, PLASMA     Status: Normal   Collection Time   05/09/12  8:01 PM      Component Value Range   Lactic Acid, Venous 1.8  0.5 - 2.2 mmol/L  BASIC METABOLIC PANEL     Status: Abnormal   Collection Time   05/10/12  5:00 AM      Component Value Range   Sodium 140  135 - 145 mEq/L   Potassium 4.0  3.5 - 5.1 mEq/L   Chloride 106  96 - 112 mEq/L   CO2 23  19 - 32 mEq/L   Glucose, Bld 96  70 - 99 mg/dL   BUN 24 (*) 6 - 23 mg/dL   Creatinine, Ser 9.56  0.50 - 1.35 mg/dL   Calcium 9.9  8.4 - 21.3 mg/dL   GFR calc non Af Amer 79 (*) >90 mL/min   GFR calc Af Amer >90  >90 mL/min  CBC     Status: Normal   Collection Time   05/10/12  5:00 AM      Component Value Range   WBC 7.9  4.0 - 10.5 K/uL   RBC 4.48  4.22 - 5.81 MIL/uL   Hemoglobin 13.8  13.0 - 17.0 g/dL   HCT 08.6  57.8 - 46.9 %  MCV 92.2  78.0 - 100.0 fL   MCH 30.8  26.0 - 34.0 pg   MCHC 33.4  30.0 - 36.0 g/dL   RDW 57.8  46.9 - 62.9 %   Platelets 154  150 - 400 K/uL     Dg Chest 1 View  05/09/2012  *RADIOLOGY REPORT*  Clinical Data: Mid to upper abdominal pain; nausea and vomiting.  CHEST - 1 VIEW  Comparison: Chest radiograph performed 07/24/2003  Findings: The lungs are well-aerated.  Chronic peribronchial thickening is noted.  There is no evidence of focal opacification, pleural effusion or pneumothorax.  The cardiomediastinal silhouette is within normal limits.  No acute osseous abnormalities are seen.  The stomach is distended with fluid and air.  Clips are noted within the right upper quadrant, reflecting prior cholecystectomy.  IMPRESSION:  1.  No acute cardiopulmonary process seen; chronic peribronchial thickening noted. 2.  Stomach distended with fluid and air.   Original Report Authenticated By: Tonia Ghent, M.D.    Ct Abdomen Pelvis W Contrast  05/09/2012  *RADIOLOGY REPORT*  Clinical Data: Upper mid  abdominal pain; history of duodenal ulcers.  Diarrhea, nausea and vomiting.  CT ABDOMEN AND PELVIS WITH CONTRAST  Technique:  Multidetector CT imaging of the abdomen and pelvis was performed following the standard protocol during bolus administration of intravenous contrast.  Contrast:  80 mL of Omnipaque 300 IV contrast  Comparison: CT of the abdomen and pelvis performed 09/30/2011  Findings: The visualized lung bases are clear.  Focal coronary artery calcification is noted.  Scattered small hypodensities within the liver are nonspecific but likely reflect small cysts.  Mild prominence of the intrahepatic biliary ducts likely reflects recent cholecystectomy.  Clips are noted at the gallbladder fossa.  The pancreas and adrenal glands are unremarkable in appearance.  A 1.0 cm isodense lesion is noted at the interpole region of the right kidney; this may have increased slightly in size from the prior CT.  As before, MRI with and without contrast would be helpful to exclude malignancy.  There is no evidence of hydronephrosis.  No renal or ureteral stones are seen.  No perinephric stranding is appreciated.  No free fluid is identified.  The small bowel is unremarkable in appearance.  The stomach is distended with contrast and air; contrast does not appear to have progressed beyond the proximal duodenum.  No acute vascular abnormalities are seen.  The appendix is normal in caliber, without evidence for appendicitis.  The colon is unremarkable in appearance.  The bladder is mildly distended and grossly unremarkable. Scattered calcification is noted within the prostate.  No inguinal lymphadenopathy is seen.  No acute osseous abnormalities are identified.  Vacuum phenomenon and mild disc space narrowing are seen at L5-S1.  IMPRESSION:  1.  Distension of the stomach with contrast and air; contrast does not appear to have extended beyond the proximal duodenum.  Small bowel is grossly unremarkable in appearance. 2.  Focal  coronary artery calcifications noted. 3.  Likely small hepatic cysts seen. 4.  1.0 cm isodense lesion at the interpole region of the right kidney; this may have increased slightly in size from the prior CT. As before, MRI with and without contrast would be helpful to exclude malignancy.   Original Report Authenticated By: Tonia Ghent, M.D.     ROS:  As stated above in the HPI otherwise negative.  Blood pressure 128/70, pulse 73, temperature 98.4 F (36.9 C), temperature source Oral, resp. rate 18, height 5\' 11"  (1.803 m),  weight 87.8 kg (193 lb 9 oz), SpO2 97.00%.    PE: Gen: NAD, Alert and Oriented HEENT:  [place]/AT, EOMI Neck: Supple, no LAD Lungs: CTA Bilaterally CV: RRR without M/G/R ABM: Soft, NTND, +BS Ext: No C/C/E  Assessment/Plan: 1) ? Gastric outlet obstruction. 2) Weight loss. 3) Abnormal CT scan.   I discussed the CT scan with Radiology.  This possibility of GOO is a new finding and a repeat EGD is warranted.  The possible obstruction is in the proximal duodenum.  Gastroparesis is also a consideration, but there should have been some trickle of contrast into the small bowel.  Liquids tend to pass even with gastroparesis.  Plan: 1) EGD now. 2) If the EGD is negative, I will order a gastric emptying scan.  Ryliegh Mcduffey D 05/10/2012, 1:59 PM

## 2012-05-10 NOTE — Op Note (Signed)
[facility] [address] [place], [address]   OPERATIVE PROCEDURE REPORT  PATIENT: David Solis, David Solis  MR#: [id number] BIRTHDATE: 07-Apr-1951  GENDER: Male ENDOSCOPIST: Jeani Hawking, MD ASSISTANT:   Beryle Beams, technician and Angelique Blonder, RN PROCEDURE DATE: 05/10/2012 PROCEDURE:   EGD, diagnostic ASA CLASS:   Class II INDICATIONS:Vomiting and abnormal CT of the GI tract. MEDICATIONS: Versed 8 mg IV, Fentanyl 75 mcg IV, and Benadryl 25 mg IV TOPICAL ANESTHETIC:  DESCRIPTION OF PROCEDURE:   After the risks benefits and alternatives of the procedure were thoroughly explained, informed consent was obtained.  The EG-[id number] ([id number])  endoscope was introduced through the mouth  and advanced to the third portion of the duodenum Without limitations.      The instrument was slowly withdrawn as the mucosa was fully examined.      FINDINGS:  A mild antral gastritis was identified.  The duodenal bulb was normal in appearance and there was no evidence of a gastric outlet obstruction.  The endoscope was able to be advanced to the third portion of the duodenum without any problems. Retroflexed views revealed no abnormalities.     The scope was then withdrawn from the patient and the procedure terminated.  COMPLICATIONS: There were no complications.  IMPRESSION: 1) Mild antral gastritis. 2) No evidence of GOO.  RECOMMENDATIONS: 1) Gastric emptying scan.   _______________________________ eSignedJeani Hawking, MD 05/10/2012 3:09 PM

## 2012-05-10 NOTE — Progress Notes (Signed)
INITIAL ADULT NUTRITION ASSESSMENT Date: 05/10/2012   Time: 3:18 PM Reason for Assessment: Nutrition risk   INTERVENTION: Diet advancement per MD. Will monitor.   Pt meets criteria for severe malnutrition of chronic illness AEB <50-75% estimated energy intake with 17.2% weight loss in the past few months per pt report.   ASSESSMENT: Male 61 y.o.  Dx: Gastric outlet obstruction  Food/Nutrition Related Hx: Pt and wife report pt with 40 pound unintended weight loss in the past few months with pt's intake being 50% of his usual amounts. Pt reports being nauseated "for a long time" and states that the nausea comes/goes. Pt denies being on any nutritional supplements at home. Pt reports abdominal pain is better today. Pt had EGD today which showed mild antral gastritis without evidence for gastric outlet obstruction.   Hx:  Past Medical History  Diagnosis Date  . Depression   . Duodenal ulcer disease   . Restless leg   . Neuropathy     in legs  . Asthma   . GERD (gastroesophageal reflux disease)    Related Meds:  Scheduled Meds:   . enoxaparin (LOVENOX) injection  40 mg Subcutaneous Q24H  . pantoprazole (PROTONIX) IV  40 mg Intravenous Q12H   Continuous Infusions:   . sodium chloride 100 mL/hr at 05/10/12 0907  . sodium chloride 500 mL (05/10/12 1436)  . [DISCONTINUED] pantoprozole (PROTONIX) infusion     PRN Meds:.morphine injection, ondansetron, [DISCONTINUED] diphenhydrAMINE, [DISCONTINUED] fentaNYL, [DISCONTINUED] midazolam  Ht: 5\' 11"  (180.3 cm)  Wt: 193 lb 9 oz (87.8 kg)  Ideal Wt: 172 lb % Ideal Wt: 112  Usual Wt: 233 lb % Usual Wt: 83  Wt Readings from Last 10 Encounters:  05/09/12 193 lb 9 oz (87.8 kg)  05/09/12 193 lb 9 oz (87.8 kg)  05/09/12 196 lb (88.905 kg)  03/21/12 192 lb (87.091 kg)  09/30/11 220 lb (99.791 kg)  09/16/11 222 lb (100.699 kg)  08/21/11 224 lb (101.606 kg)      Body mass index is 27.00 kg/(m^2).   Labs:  CMP     Component  Value Date/Time   NA 140 05/10/2012 0500   K 4.0 05/10/2012 0500   CL 106 05/10/2012 0500   CO2 23 05/10/2012 0500   GLUCOSE 96 05/10/2012 0500   BUN 24* 05/10/2012 0500   CREATININE 1.00 05/10/2012 0500   CALCIUM 9.9 05/10/2012 0500   PROT 7.8 05/09/2012 0441   ALBUMIN 4.3 05/09/2012 0441   AST 21 05/09/2012 0441   ALT 13 05/09/2012 0441   ALKPHOS 73 05/09/2012 0441   BILITOT 0.6 05/09/2012 0441   GFRNONAA 79* 05/10/2012 0500   GFRAA >90 05/10/2012 0500    Intake/Output Summary (Last 24 hours) at 05/10/12 1528 Last data filed at 05/10/12 1102  Gross per 24 hour  Intake      0 ml  Output    400 ml  Net   -400 ml   Last BM - 11/22  Diet Order: NPO    IVF:    sodium chloride Last Rate: 100 mL/hr at 05/10/12 0907  sodium chloride Last Rate: 500 mL (05/10/12 1436)  [DISCONTINUED] pantoprozole (PROTONIX) infusion     Estimated Nutritional Needs:   Kcal:2050-2250 Protein:90-105g Fluid:2-2.2L  NUTRITION DIAGNOSIS: -Inadequate oral intake (NI-2.1).  Status: Ongoing  RELATED TO: inability to eat  AS EVIDENCE BY: NPO  MONITORING/EVALUATION(Goals): 1. Resolution of nausea/vomiting/abdominal pain 2. Advance diet as tolerated to regular diet  EDUCATION NEEDS: -No education needs identified at this time  Dietitian #: [phone]  DOCUMENTATION CODES Per approved criteria  -Severe malnutrition in the context of chronic illness    Marshall Cork 05/10/2012, 3:18 PM

## 2012-05-11 ENCOUNTER — Encounter (HOSPITAL_COMMUNITY): Payer: Self-pay

## 2012-05-11 ENCOUNTER — Inpatient Hospital Stay (HOSPITAL_COMMUNITY): Payer: 59

## 2012-05-11 ENCOUNTER — Encounter (HOSPITAL_COMMUNITY): Payer: Self-pay | Admitting: Gastroenterology

## 2012-05-11 MED ORDER — TECHNETIUM TC 99M SULFUR COLLOID
2.2000 | Freq: Once | INTRAVENOUS | Status: AC | PRN
  Administered 2012-05-11: 2.2 via ORAL

## 2012-05-11 NOTE — Progress Notes (Signed)
Patient ID: David Solis, male   DOB: Aug 05, 1950, 61 y.o.   MRN: [id number] [facility] PROGRESS NOTE  David Solis [id number] DOB: 05-03-1951 DOA: 05/09/2012 PCP: [name], FNP  Brief narrative: Pt is 61 yo male with PMH of chronic abdominal pain, associated with constipation, who presented to ED at [facility] for nausea/vomiting/abdominal pain. Discharged from ED to follow-up with [name] same day. Seen in office 11/25 by [name], reported to be stable, but unable to eat/drink. Direct admission requested for vomiting, acute/chronic abdominal pain, stomach distension.  ASSESSMENT AND PLAN::  1. Nausea & vomiting -- possibly related to gastroparesis as per gastric emptying study, delayed emptying was noted, further work up by GI, will continue to follow 2. Stomach distension -- still slightly distended on exam this AM, EGD with mild gastritis and delayed emptying on gastric emptying study 3. Known duodenal ulcers by history--PPI. Note Protonix infusion was ordered on admission but not administered. Patient without bleeding/hematemesis or pain. BID PPI. 4. Chronic abdominal pain--currently stable. 5. Right kidney lesion--consider MRI to further evaluate, will defer to outpatient setting. 6. GERD--PPI as above. 7. Depression--resume Pristiq when able to take PO. 8. RLS--resume Requip when able to take PO.  Consultants:  GI - Dr. Elnoria Howard  Procedures/Studies: Nm Gastric Emptying 05/11/2012   Delayed gastric emptying.   EGD 05/10/2012 --> Dr. Elnoria Howard 1) Mild antral gastritis.  2) No evidence of GOO.   Antibiotics:  None  Code Status: Full Family Communication: Pt at bedside Disposition Plan: Home when medically stable  HPI/Subjective: No events overnight.   Objective: Filed Vitals:   05/10/12 1533 05/10/12 1555 05/10/12 2150 05/11/12 0600  BP: 107/76 105/70 114/69 136/70  Pulse:  59 64 52  Temp:  98.2 F (36.8 C) 98.9 F (37.2 C) 97.5 F (36.4 C)  TempSrc:  Oral  Oral Oral  Resp: 14 17 18 18   Height:      Weight:      SpO2: 100% 93% 98% 96%    Intake/Output Summary (Last 24 hours) at 05/11/12 1149 Last data filed at 05/11/12 0636  Gross per 24 hour  Intake   1460 ml  Output      0 ml  Net   1460 ml    Exam:   General:  Pt is alert, follows commands appropriately, not in acute distress  Cardiovascular: Regular rate and rhythm, S1/S2, no murmurs, no rubs, no gallops  Respiratory: Clear to auscultation bilaterally, no wheezing, no crackles, no rhonchi  Abdomen: Soft, non tender, mildly distended, bowel sounds present, no guarding  Extremities: No edema, pulses DP and PT palpable bilaterally  Neuro: Grossly nonfocal  Data Reviewed: Basic Metabolic Panel:  Lab 05/10/12 5784 05/09/12 0441  NA 140 139  K 4.0 4.5  CL 106 103  CO2 23 21  GLUCOSE 96 160*  BUN 24* 22  CREATININE 1.00 1.00  CALCIUM 9.9 10.7*  MG -- --  PHOS -- --   Liver Function Tests:  Lab 05/09/12 0441  AST 21  ALT 13  ALKPHOS 73  BILITOT 0.6  PROT 7.8  ALBUMIN 4.3    Lab 05/09/12 0441  LIPASE 50  AMYLASE --   CBC:  Lab 05/10/12 0500 05/09/12 0441  WBC 7.9 8.9  NEUTROABS -- 7.0  HGB 13.8 15.0  HCT 41.3 44.0  MCV 92.2 89.8  PLT 154 169   Scheduled Meds:   . enoxaparin (LOVENOX) injection  40 mg Subcutaneous Q24H  . pantoprazole (PROTONIX) IV  40 mg Intravenous Q12H  Continuous Infusions:   . sodium chloride 100 mL/hr at 05/10/12 0907  . [DISCONTINUED] sodium chloride 500 mL (05/10/12 1436)     Debbora Presto, MD  [facility] Pager [phone]  If 7PM-7AM, please contact night-coverage [email or web address] Password [email or web address] 05/11/2012, 11:49 AM   LOS: 2 days

## 2012-05-11 NOTE — Progress Notes (Signed)
Patient resting, no pain, no need for nausea medication, will continue to monitor

## 2012-05-12 LAB — GLUCOSE, CAPILLARY: Glucose-Capillary: 87 mg/dL (ref 70–99)

## 2012-05-12 MED ORDER — METOCLOPRAMIDE HCL 5 MG/ML IJ SOLN
5.0000 mg | Freq: Three times a day (TID) | INTRAMUSCULAR | Status: DC
  Administered 2012-05-12 – 2012-05-13 (×3): 5 mg via INTRAVENOUS
  Filled 2012-05-12 (×2): qty 1
  Filled 2012-05-12: qty 2
  Filled 2012-05-12 (×2): qty 1

## 2012-05-12 MED ORDER — METOCLOPRAMIDE HCL 5 MG/ML IJ SOLN
5.0000 mg | Freq: Three times a day (TID) | INTRAMUSCULAR | Status: DC
  Administered 2012-05-12: 5 mg via INTRAVENOUS
  Filled 2012-05-12: qty 2

## 2012-05-12 MED ORDER — ZOLPIDEM TARTRATE 10 MG PO TABS
10.0000 mg | ORAL_TABLET | Freq: Every evening | ORAL | Status: DC | PRN
Start: 2012-05-12 — End: 2012-05-14

## 2012-05-12 NOTE — Progress Notes (Signed)
Patients wife requested that blood sugar be taken due to concerns of possible elevated blood sugar, taken and reading was 87, Patient requested that patient undergo Hb A1c testing to r/o possible diabetes, will continue to monitor

## 2012-05-12 NOTE — Progress Notes (Signed)
Patient ID: David Solis, male   DOB: 24-May-1951, 61 y.o.   MRN: [id number]  [facility] PROGRESS NOTE  David Solis [id number]:[id number] DOB: 02/08/1951 DOA: 05/09/2012 PCP: David Palau, FNP  Brief narrative:  Pt is 61 yo male with PMH of chronic abdominal pain, associated with constipation, who presented to ED at [facility] for nausea/vomiting/abdominal pain. Discharged from ED to follow-up with [name] same day. Seen in office 11/25 by [name], reported to be stable, but unable to eat/drink. Direct admission requested for vomiting, acute/chronic abdominal pain, stomach distension.   ASSESSMENT AND PLAN::  1. Nausea & vomiting -- possibly related to gastroparesis as per gastric emptying study, delayed emptying was noted, will start Reglan for now and will try to advance the diet if pt tolerating. Follow up on GI recommendations. 2. Stomach distension -- EGD with mild gastritis and delayed emptying on gastric emptying study, no distension this AM  3. Known duodenal ulcers by history--PPI.  Patient without bleeding/hematemesis or pain. BID PPI. 4. Chronic abdominal pain--currently stable. 5. Right kidney lesion--consider MRI to further evaluate, will defer to outpatient setting. 6. GERD--PPI as above. 7. Depression--resume Pristiq if pt able to tolerate PO 8. RLS--resume Requip when able to take PO.  Consultants:  GI - Dr. Elnoria Howard  Procedures/Studies:  Nm Gastric Emptying  05/11/2012  Delayed gastric emptying.   EGD 05/10/2012 --> Dr. Elnoria Howard  1) Mild antral gastritis.  2) No evidence of GOO.   Antibiotics:  None  Code Status: Full  Family Communication: Pt at bedside  Disposition Plan: Home when medically stable  HPI/Subjective: No events overnight.   Objective: Filed Vitals:   05/11/12 0600 05/11/12 1410 05/11/12 2109 05/12/12 0600  BP: 136/70 133/79 148/79 147/79  Pulse: 52 54 68 67  Temp: 97.5 F (36.4 C) 97.5 F (36.4 C) 98 F (36.7 C) 97.6 F (36.4 C)  TempSrc:  Oral  Oral Oral  Resp: 18 18 18 18   Height:      Weight:      SpO2: 96% 97% 99% 97%    Intake/Output Summary (Last 24 hours) at 05/12/12 0932 Last data filed at 05/12/12 0600  Gross per 24 hour  Intake   2240 ml  Output   1100 ml  Net   1140 ml    Exam:   General:  Pt is alert, follows commands appropriately, not in acute distress  Cardiovascular: Regular rate and rhythm, S1/S2, no murmurs, no rubs, no gallops  Respiratory: Clear to auscultation bilaterally, no wheezing, no crackles, no rhonchi  Abdomen: Soft, mild tenderness in the epigastric area, non distended, bowel sounds present, no guarding  Extremities: No edema, pulses DP and PT palpable bilaterally  Neuro: Grossly nonfocal  Data Reviewed: Basic Metabolic Panel:  Lab 05/10/12 9562 05/09/12 0441  NA 140 139  K 4.0 4.5  CL 106 103  CO2 23 21  GLUCOSE 96 160*  BUN 24* 22  CREATININE 1.00 1.00  CALCIUM 9.9 10.7*  MG -- --  PHOS -- --   Liver Function Tests:  Lab 05/09/12 0441  AST 21  ALT 13  ALKPHOS 73  BILITOT 0.6  PROT 7.8  ALBUMIN 4.3    Lab 05/09/12 0441  LIPASE 50  AMYLASE --   CBC:  Lab 05/10/12 0500 05/09/12 0441  WBC 7.9 8.9  NEUTROABS -- 7.0  HGB 13.8 15.0  HCT 41.3 44.0  MCV 92.2 89.8  PLT 154 169   Scheduled Meds:   . enoxaparin (LOVENOX) injection  40 mg  Subcutaneous Q24H  . pantoprazole (PROTONIX) IV  40 mg Intravenous Q12H   Continuous Infusions:   . sodium chloride 100 mL/hr (05/11/12 1931)     Debbora Presto, MD  [facility] Pager [phone]  If 7PM-7AM, please contact night-coverage [email or web address] Password [email or web address] 05/12/2012, 9:32 AM   LOS: 3 days

## 2012-05-12 NOTE — Progress Notes (Signed)
Patient expressed that he would like something to assist him to sleep tonight, will continue to monitor

## 2012-05-13 ENCOUNTER — Inpatient Hospital Stay (HOSPITAL_COMMUNITY): Payer: 59

## 2012-05-13 LAB — BASIC METABOLIC PANEL
Calcium: 9.7 mg/dL (ref 8.4–10.5)
GFR calc Af Amer: >90 mL/min (ref >90)
GFR calc non Af Amer: 90 mL/min — ABNORMAL LOW (ref >90)
Glucose, Bld: 119 mg/dL — ABNORMAL HIGH (ref 70–99)
Potassium: 3.5 mEq/L (ref 3.5–5.1)
Sodium: 138 mEq/L (ref 135–145)

## 2012-05-13 LAB — CBC
Hemoglobin: 14.7 g/dL (ref 13.0–17.0)
MCH: 30.5 pg (ref 26.0–34.0)
MCHC: 34.6 g/dL (ref 30.0–36.0)
RDW: 12.7 % (ref 11.5–15.5)

## 2012-05-13 LAB — HEMOGLOBIN A1C: Mean Plasma Glucose: 114 mg/dL (ref <117)

## 2012-05-13 MED ORDER — GADOBENATE DIMEGLUMINE 529 MG/ML IV SOLN
18.0000 mL | Freq: Once | INTRAVENOUS | Status: AC | PRN
  Administered 2012-05-13: 18 mL via INTRAVENOUS

## 2012-05-13 MED ORDER — METOCLOPRAMIDE HCL 5 MG PO TABS
5.0000 mg | ORAL_TABLET | Freq: Three times a day (TID) | ORAL | Status: DC
  Administered 2012-05-13 – 2012-05-14 (×3): 5 mg via ORAL
  Filled 2012-05-13 (×7): qty 1

## 2012-05-13 MED ORDER — METOCLOPRAMIDE HCL 5 MG/ML IJ SOLN: 10.0000 mg | Freq: Three times a day (TID) | INTRAMUSCULAR | Status: DC

## 2012-05-13 NOTE — Progress Notes (Signed)
Patient ID: David Solis, male   DOB: 06/18/1950, 61 y.o.   MRN: [id number]  [facility] PROGRESS NOTE  David Solis [id number] DOB: 1951-04-05 DOA: 05/09/2012 PCP: [name], FNP  Brief narrative:  Brief narrative:  Pt is 61 yo male with PMH of chronic abdominal pain, associated with constipation, who presented to ED at [facility] for nausea/vomiting/abdominal pain. Discharged from ED to follow-up with [name] same day. Seen in office 11/25 by [name], reported to be stable, but unable to eat/drink. Direct admission requested for vomiting, acute/chronic abdominal pain, stomach distension.   ASSESSMENT AND PLAN::  1. Nausea & vomiting -- secondary to gastroparesis, continue Reglan. Advance diet 2. Stomach distension -- EGD with mild gastritis and delayed emptying on gastric emptying study, resolved 3. Known duodenal ulcers by history--PPI. Patient without bleeding/hematemesis or pain. BID PPI. 4. Chronic abdominal pain--currently stable. 5. Right kidney lesion--MRI with no acute findings 6. GERD--PPI as above. 7. Depression--resume Pristiq if pt able to tolerate PO 8. RLS--resume Requip when able to take PO.  Consultants:  GI - Dr. Elnoria Howard  Procedures/Studies:  Nm Gastric Emptying  05/11/2012  Delayed gastric emptying.   EGD 05/10/2012 --> Dr. Elnoria Howard  1) Mild antral gastritis.  2) No evidence of GOO.   Antibiotics:  None  Code Status: Full  Family Communication: Pt at bedside  Disposition Plan: Home when medically stable     HPI/Subjective: No events overnight.   Objective: Filed Vitals:   05/12/12 1322 05/12/12 2200 05/13/12 0600 05/13/12 1500  BP: 140/80 131/84 144/82 138/82  Pulse: 72 64 60 65  Temp: 98.4 F (36.9 C) 97.9 F (36.6 C) 98 F (36.7 C) 98.2 F (36.8 C)  TempSrc: Oral Oral Oral Oral  Resp: 16 18 18 18   Height:      Weight:      SpO2: 99% 98% 95% 96%    Intake/Output Summary (Last 24 hours) at 05/13/12 1642 Last data filed at  05/13/12 1633  Gross per 24 hour  Intake   2555 ml  Output    700 ml  Net   1855 ml    Exam:   General:  Pt is alert, follows commands appropriately, not in acute distress  Cardiovascular: Regular rate and rhythm, S1/S2, no murmurs, no rubs, no gallops  Respiratory: Clear to auscultation bilaterally, no wheezing, no crackles, no rhonchi  Abdomen: Soft, non tender, non distended, bowel sounds present, no guarding  Extremities: No edema, pulses DP and PT palpable bilaterally  Neuro: Grossly nonfocal  Data Reviewed: Basic Metabolic Panel:  Lab 05/13/12 4401 05/10/12 0500 05/09/12 0441  NA 138 140 139  K 3.5 4.0 4.5  CL 103 106 103  CO2 24 23 21   GLUCOSE 119* 96 160*  BUN 15 24* 22  CREATININE 0.89 1.00 1.00  CALCIUM 9.7 9.9 10.7*  MG -- -- --  PHOS -- -- --   Liver Function Tests:  Lab 05/09/12 0441  AST 21  ALT 13  ALKPHOS 73  BILITOT 0.6  PROT 7.8  ALBUMIN 4.3    Lab 05/09/12 0441  LIPASE 50  AMYLASE --   No results found for this basename: AMMONIA:5 in the last 168 hours CBC:  Lab 05/13/12 0730 05/10/12 0500 05/09/12 0441  WBC 5.0 7.9 8.9  NEUTROABS -- -- 7.0  HGB 14.7 13.8 15.0  HCT 42.5 41.3 44.0  MCV 88.2 92.2 89.8  PLT 154 154 169   Cardiac Enzymes: No results found for this basename: CKTOTAL:5,CKMB:5,CKMBINDEX:5,TROPONINI:5 in the last  168 hours BNP: No components found with this basename: POCBNP:5 CBG:  Lab 05/12/12 2110  GLUCAP 87    No results found for this or any previous visit (from the past 240 hour(s)).   Scheduled Meds:   . enoxaparin (LOVENOX) injection  40 mg Subcutaneous Q24H  . metoCLOPramide  5 mg Oral TID AC & HS  . pantoprazole (PROTONIX) IV  40 mg Intravenous Q12H  . [DISCONTINUED] metoCLOPramide (REGLAN) injection  10 mg Intravenous TID AC  . [DISCONTINUED] metoCLOPramide (REGLAN) injection  5 mg Intravenous TID AC   Continuous Infusions:   . sodium chloride 100 mL/hr (05/13/12 1324)     Debbora Presto, MD  [facility] Pager [phone]  If 7PM-7AM, please contact night-coverage [email or web address] Password [email or web address] 05/13/2012, 4:42 PM   LOS: 4 days

## 2012-05-13 NOTE — Progress Notes (Signed)
Subjective: Since I last evaluated the patient,he seems to be doing better. He still has some abdominal pain that he rates at a 4-5 /10 in intensity. He denies having any nausea or vomiting. Still on liquid diet. He expresses some concern about the side effects of Reglan. Gastric emptying study reveals a 90% retention at 120 minutes.    Objective: Vital signs in last 24 hours: Temp:  [97.9 F (36.6 C)-98 F (36.7 C)] 98 F (36.7 C) (11/29 0600) Pulse Rate:  [60-64] 60  (11/29 0600) Resp:  [18] 18  (11/29 0600) BP: (131-144)/(82-84) 144/82 mmHg (11/29 0600) SpO2:  [95 %-98 %] 95 % (11/29 0600) Last BM Date: 05/12/12  Intake/Output from previous day: 11/28 0701 - 11/29 0700 In: 1400 [P.O.:600; I.V.:800] Out: 1175 [Urine:1175] Intake/Output this shift:   General appearance: alert, cooperative, appears stated age and no distress Resp: clear to auscultation bilaterally Cardio: regular rate and rhythm, S1, S2 normal, no murmur, click, rub or gallop GI: soft, non-tender; bowel sounds normal; no masses,  no organomegaly Extremities: extremities normal, atraumatic, no cyanosis or edema  Lab Results:  Basename 05/13/12 0730  WBC 5.0  HGB 14.7  HCT 42.5  PLT 154   BMET  Basename 05/13/12 0730  NA 138  K 3.5  CL 103  CO2 24  GLUCOSE 119*  BUN 15  CREATININE 0.89  CALCIUM 9.7   Studies/Results: No results found.  Medications: I have reviewed the patient's current medications.  Assessment/Plan: 1) Gastroparesis: Patient will benefit from Reglan PO 5 mg QID, 15 minutes before meals and at bedtime. I have advised him to titrate the dose slowly according to his symptoms and minimize the use of narcotics. I discussed with him the fact that he could increase the dose slowly to a maximum of 40 mg per day if needed. Side effects like tardive dyskinesia are not common and I have told him about this as well. He is also aware that there are very limited options with regards to medical  management of gastroparesis. 2) Acid reflux: on PPI's.     LOS: 4 days   Pura Picinich 05/13/2012, 2:50 PM

## 2012-05-14 MED ORDER — PROMETHAZINE HCL 25 MG RE SUPP: 25.0000 mg | Freq: Four times a day (QID) | RECTAL | Status: DC | PRN

## 2012-05-14 MED ORDER — METOCLOPRAMIDE HCL 5 MG PO TABS: 5.0000 mg | ORAL_TABLET | Freq: Three times a day (TID) | ORAL | Status: DC

## 2012-05-14 MED ORDER — ZOLPIDEM TARTRATE 10 MG PO TABS: 10.0000 mg | ORAL_TABLET | Freq: Every evening | ORAL | Status: DC | PRN

## 2012-05-14 MED ORDER — OXYCODONE-ACETAMINOPHEN 5-325 MG PO TABS: 1.0000 | ORAL_TABLET | Freq: Four times a day (QID) | ORAL | Status: DC | PRN

## 2012-05-14 NOTE — Progress Notes (Signed)
At this time he receives, and signs for receipt of, d/c instructions and prescriptions.  He then, per his request, ambulates with his wife to his vehicle with escort by our tech. Without incident.  He thanks [place] for our care.

## 2012-05-14 NOTE — Discharge Summary (Signed)
Physician Discharge Summary  Traeson Ceci ZOX:[id number] DOB: Aug 23, 1950 DOA: 05/09/2012  PCP: [name], FNP  Admit date: 05/09/2012 Discharge date: 05/14/2012  Recommendations for Outpatient Follow-up:  1. Pt will need to follow up with PCP in 2-3 weeks post discharge 2. Please obtain BMP to evaluate electrolytes and kidney function 3. Please also check CBC to evaluate Hg and Hct levels 4. Pt was started on Reglan for new diagnosis of Gastroparesis   Discharge Diagnoses: Abdominal pain secondary to gastroparesis  Principal Problem:  *Gastric outlet obstruction Active Problems:  Depression  Duodenal ulcer  GERD (gastroesophageal reflux disease)  Neuropathy of lower extremity  Restless leg syndrome  Nausea & vomiting  Chronic abdominal pain  Discharge Condition: Stable  Diet recommendation: Heart healthy diet discussed in details   History of present illness:  Brief narrative:  Pt is 61 yo male with PMH of chronic abdominal pain, associated with constipation, who presented to ED at [facility] for nausea/vomiting/abdominal pain. Discharged from ED to follow-up with [name] same day. Seen in office 11/25 by [name], reported to be stable, but unable to eat/drink. Direct admission requested for vomiting, acute/chronic abdominal pain, stomach distension.   ASSESSMENT AND PLAN::  1. Nausea & vomiting -- secondary to gastroparesis, continued Reglan. Advance diet and pt tolerating well. Ready for discharge home today. 2. Stomach distension -- EGD with mild gastritis and delayed emptying on gastric emptying study, resolved 3. Known duodenal ulcers by history--PPI. Patient without bleeding/hematemesis or pain. BID PPI. 4. Chronic abdominal pain--currently stable. 5. Right kidney lesion--MRI with no acute findings and this was discussed with pt and his wife  6. GERD--PPI as above. 7. Depression--continue Pristiq 8. RLS--resume Requip when able to take PO.  Consultants:  GI - Dr.  Elnoria Howard  Procedures/Studies:  Nm Gastric Emptying  05/11/2012  Delayed gastric emptying.  EGD 05/10/2012 --> Dr. Elnoria Howard  1) Mild antral gastritis.  2) No evidence of GOO.   Antibiotics:  None  Discharge Exam: Filed Vitals:   05/14/12 0624  BP: 153/87  Pulse: 69  Temp: 97.9 F (36.6 C)  Resp: 20   Filed Vitals:   05/13/12 0600 05/13/12 1500 05/13/12 2145 05/14/12 0624  BP: 144/82 138/82 117/67 153/87  Pulse: 60 65 69 69  Temp: 98 F (36.7 C) 98.2 F (36.8 C) 97.6 F (36.4 C) 97.9 F (36.6 C)  TempSrc: Oral Oral Oral Oral  Resp: 18 18 20 20   Height:      Weight:      SpO2: 95% 96% 98% 98%    General: Pt is alert, follows commands appropriately, not in acute distress Cardiovascular: Regular rate and rhythm, S1/S2 +, no murmurs, no rubs, no gallops Respiratory: Clear to auscultation bilaterally, no wheezing, no crackles, no rhonchi Abdominal: Soft, non tender, non distended, bowel sounds +, no guarding Extremities: no edema, no cyanosis, pulses palpable bilaterally DP and PT Neuro: Grossly nonfocal  Discharge Instructions  Discharge Orders    Future Orders Please Complete By Expires   Diet - low sodium heart healthy      Increase activity slowly          Medication List     As of 05/14/2012  9:41 AM    TAKE these medications         desvenlafaxine 50 MG 24 hr tablet   Commonly known as: PRISTIQ   Take 50 mg by mouth daily.      gabapentin 300 MG capsule   Commonly known as: NEURONTIN  Take 300 mg by mouth 3 (three) times daily.      metoCLOPramide 5 MG tablet   Commonly known as: REGLAN   Take 1 tablet (5 mg total) by mouth 4 (four) times daily -  before meals and at bedtime.      omeprazole 40 MG capsule   Commonly known as: PRILOSEC   Take 40 mg by mouth daily.      oxyCODONE-acetaminophen 5-325 MG per tablet   Commonly known as: PERCOCET/ROXICET   Take 1 tablet by mouth every 6 (six) hours as needed for pain.      promethazine 25 MG  suppository   Commonly known as: PHENERGAN   Place 1 suppository (25 mg total) rectally every 6 (six) hours as needed for nausea.      rOPINIRole 3 MG tablet   Commonly known as: REQUIP   Take 3 mg by mouth at bedtime.      tetrahydrozoline-zinc 0.05-0.25 % ophthalmic solution   Commonly known as: VISINE-AC   Place 2 drops into both eyes 3 (three) times daily as needed.      zolpidem 10 MG tablet   Commonly known as: AMBIEN   Take 1 tablet (10 mg total) by mouth at bedtime as needed for sleep.           Follow-up Information    Follow up with [name], FNP. In 2 weeks.   Contact information:   [address] [place] [address] [phone]       Follow up with MANN,JYOTHI, MD. In 4 weeks.   Contact information:   [address] [place] [address] [phone]           The results of significant diagnostics from this hospitalization (including imaging, microbiology, ancillary and laboratory) are listed below for reference.     Microbiology: No results found for this or any previous visit (from the past 240 hour(s)).   Labs: Basic Metabolic Panel:  Lab 05/13/12 5784 05/10/12 0500 05/09/12 0441  NA 138 140 139  K 3.5 4.0 4.5  CL 103 106 103  CO2 24 23 21   GLUCOSE 119* 96 160*  BUN 15 24* 22  CREATININE 0.89 1.00 1.00  CALCIUM 9.7 9.9 10.7*  MG -- -- --  PHOS -- -- --   Liver Function Tests:  Lab 05/09/12 0441  AST 21  ALT 13  ALKPHOS 73  BILITOT 0.6  PROT 7.8  ALBUMIN 4.3    Lab 05/09/12 0441  LIPASE 50  AMYLASE --    CBC:  Lab 05/13/12 0730 05/10/12 0500 05/09/12 0441  WBC 5.0 7.9 8.9  NEUTROABS -- -- 7.0  HGB 14.7 13.8 15.0  HCT 42.5 41.3 44.0  MCV 88.2 92.2 89.8  PLT 154 154 169   CBG:  Lab 05/12/12 2110  GLUCAP 87     SIGNED: Time coordinating discharge: Over 30 minutes  Debbora Presto, MD  [facility] 05/14/2012, 9:41 AM Pager [phone]  If 7PM-7AM, please contact  night-coverage [email or web address] Password [email or web address]

## 2012-05-26 DIAGNOSIS — F411 Generalized anxiety disorder: Secondary | ICD-10-CM | POA: Diagnosis not present

## 2012-05-26 DIAGNOSIS — F339 Major depressive disorder, recurrent, unspecified: Secondary | ICD-10-CM | POA: Diagnosis not present

## 2012-07-05 DIAGNOSIS — L259 Unspecified contact dermatitis, unspecified cause: Secondary | ICD-10-CM | POA: Diagnosis not present

## 2012-09-01 ENCOUNTER — Other Ambulatory Visit: Payer: Self-pay

## 2012-09-01 ENCOUNTER — Emergency Department (HOSPITAL_COMMUNITY)
Admission: EM | Admit: 2012-09-01 | Discharge: 2012-09-03 | Disposition: A | Discharge status: Other Acute Inpatient Hospital | Payer: 59 | Attending: Emergency Medicine | Admitting: Emergency Medicine

## 2012-09-01 ENCOUNTER — Emergency Department (HOSPITAL_COMMUNITY): Payer: 59

## 2012-09-01 ENCOUNTER — Encounter (HOSPITAL_COMMUNITY): Payer: Self-pay

## 2012-09-01 DIAGNOSIS — F3289 Other specified depressive episodes: Secondary | ICD-10-CM | POA: Insufficient documentation

## 2012-09-01 DIAGNOSIS — Z8719 Personal history of other diseases of the digestive system: Secondary | ICD-10-CM | POA: Diagnosis not present

## 2012-09-01 DIAGNOSIS — T424X4A Poisoning by benzodiazepines, undetermined, initial encounter: Secondary | ICD-10-CM | POA: Diagnosis not present

## 2012-09-01 DIAGNOSIS — Z0389 Encounter for observation for other suspected diseases and conditions ruled out: Secondary | ICD-10-CM | POA: Diagnosis not present

## 2012-09-01 DIAGNOSIS — Z87891 Personal history of nicotine dependence: Secondary | ICD-10-CM | POA: Insufficient documentation

## 2012-09-01 DIAGNOSIS — T438X2A Poisoning by other psychotropic drugs, intentional self-harm, initial encounter: Secondary | ICD-10-CM | POA: Insufficient documentation

## 2012-09-01 DIAGNOSIS — S6990XA Unspecified injury of unspecified wrist, hand and finger(s), initial encounter: Secondary | ICD-10-CM | POA: Diagnosis not present

## 2012-09-01 DIAGNOSIS — J45909 Unspecified asthma, uncomplicated: Secondary | ICD-10-CM | POA: Insufficient documentation

## 2012-09-01 DIAGNOSIS — F329 Major depressive disorder, single episode, unspecified: Secondary | ICD-10-CM | POA: Diagnosis not present

## 2012-09-01 DIAGNOSIS — T50901A Poisoning by unspecified drugs, medicaments and biological substances, accidental (unintentional), initial encounter: Secondary | ICD-10-CM | POA: Diagnosis not present

## 2012-09-01 DIAGNOSIS — G2581 Restless legs syndrome: Secondary | ICD-10-CM | POA: Insufficient documentation

## 2012-09-01 DIAGNOSIS — Z79899 Other long term (current) drug therapy: Secondary | ICD-10-CM | POA: Insufficient documentation

## 2012-09-01 DIAGNOSIS — T43502A Poisoning by unspecified antipsychotics and neuroleptics, intentional self-harm, initial encounter: Secondary | ICD-10-CM | POA: Insufficient documentation

## 2012-09-01 DIAGNOSIS — R4182 Altered mental status, unspecified: Secondary | ICD-10-CM | POA: Insufficient documentation

## 2012-09-01 DIAGNOSIS — G589 Mononeuropathy, unspecified: Secondary | ICD-10-CM | POA: Insufficient documentation

## 2012-09-01 DIAGNOSIS — K219 Gastro-esophageal reflux disease without esophagitis: Secondary | ICD-10-CM | POA: Diagnosis not present

## 2012-09-01 LAB — COMPREHENSIVE METABOLIC PANEL
Albumin: 3.4 g/dL — ABNORMAL LOW (ref 3.5–5.2)
BUN: 22 mg/dL (ref 6–23)
Creatinine, Ser: 1.15 mg/dL (ref 0.50–1.35)
Potassium: 3.9 mEq/L (ref 3.5–5.1)
Total Protein: 6.2 g/dL (ref 6.0–8.3)

## 2012-09-01 LAB — CBC WITH DIFFERENTIAL/PLATELET
Basophils Absolute: 0 10*3/uL (ref 0.0–0.1)
Basophils Relative: 1 % (ref 0–1)
Eosinophils Relative: 2 % (ref 0–5)
HCT: 46.9 % (ref 39.0–52.0)
Lymphocytes Relative: 39 % (ref 12–46)
MCHC: 34.1 g/dL (ref 30.0–36.0)
MCV: 91.8 fL (ref 78.0–100.0)
Monocytes Absolute: 0.4 10*3/uL (ref 0.1–1.0)
Neutro Abs: 3 10*3/uL (ref 1.7–7.7)
Platelets: 162 10*3/uL (ref 150–400)
RDW: 13.3 % (ref 11.5–15.5)
WBC: 5.9 10*3/uL (ref 4.0–10.5)

## 2012-09-01 LAB — GLUCOSE, CAPILLARY
Glucose-Capillary: 100 mg/dL — ABNORMAL HIGH (ref 70–99)
Glucose-Capillary: 78 mg/dL (ref 70–99)
Glucose-Capillary: 88 mg/dL (ref 70–99)
Glucose-Capillary: 78 mg/dL (ref 70–99)
Glucose-Capillary: 58 mg/dL — ABNORMAL LOW (ref 70–99)
Glucose-Capillary: 60 mg/dL — ABNORMAL LOW (ref 70–99)
Glucose-Capillary: 62 mg/dL — ABNORMAL LOW (ref 70–99)
Glucose-Capillary: 65 mg/dL — ABNORMAL LOW (ref 70–99)
Glucose-Capillary: 67 mg/dL — ABNORMAL LOW (ref 70–99)
Glucose-Capillary: 68 mg/dL — ABNORMAL LOW (ref 70–99)

## 2012-09-01 LAB — ETHANOL: Alcohol, Ethyl (B): <11 mg/dL (ref 0–11)

## 2012-09-01 LAB — SALICYLATE LEVEL: Salicylate Lvl: <2 mg/dL — ABNORMAL LOW (ref 2.8–20.0)

## 2012-09-01 LAB — URINALYSIS, ROUTINE W REFLEX MICROSCOPIC
Bilirubin Urine: NEGATIVE
Ketones, ur: NEGATIVE mg/dL
Leukocytes, UA: NEGATIVE
Nitrite: NEGATIVE
Urobilinogen, UA: 0.2 mg/dL (ref 0.0–1.0)

## 2012-09-01 LAB — CK TOTAL AND CKMB (NOT AT ARMC): Relative Index: INVALID (ref 0.0–2.5)

## 2012-09-01 LAB — ACETAMINOPHEN LEVEL: Acetaminophen (Tylenol), Serum: <15 ug/mL (ref 10–30)

## 2012-09-01 LAB — RAPID URINE DRUG SCREEN, HOSP PERFORMED: Barbiturates: NOT DETECTED

## 2012-09-01 MED ORDER — IBUPROFEN 600 MG PO TABS
600.0000 mg | ORAL_TABLET | Freq: Three times a day (TID) | ORAL | Status: DC | PRN
  Administered 2012-09-03: 600 mg via ORAL
  Filled 2012-09-01: qty 1

## 2012-09-01 MED ORDER — NICOTINE 21 MG/24HR TD PT24: 21.0000 mg | MEDICATED_PATCH | Freq: Every day | TRANSDERMAL | Status: DC | PRN

## 2012-09-01 MED ORDER — ZOLPIDEM TARTRATE 5 MG PO TABS
5.0000 mg | ORAL_TABLET | Freq: Every evening | ORAL | Status: DC | PRN
  Administered 2012-09-02: 5 mg via ORAL
  Filled 2012-09-01: qty 1

## 2012-09-01 MED ORDER — DESVENLAFAXINE SUCCINATE ER 50 MG PO TB24
50.0000 mg | ORAL_TABLET | Freq: Every day | ORAL | Status: DC
  Administered 2012-09-01 – 2012-09-02 (×2): 50 mg via ORAL
  Filled 2012-09-01 (×4): qty 1

## 2012-09-01 MED ORDER — NALOXONE HCL 1 MG/ML IJ SOLN
INTRAMUSCULAR | Status: AC
  Administered 2012-09-01: 1 mg
  Filled 2012-09-01: qty 2

## 2012-09-01 MED ORDER — ALUM & MAG HYDROXIDE-SIMETH 200-200-20 MG/5ML PO SUSP: 30.0000 mL | ORAL | Status: DC | PRN

## 2012-09-01 MED ORDER — ACETAMINOPHEN 325 MG PO TABS: 650.0000 mg | ORAL_TABLET | ORAL | Status: DC | PRN

## 2012-09-01 MED ORDER — SODIUM CHLORIDE 0.9 % IV BOLUS (SEPSIS)
1000.0000 mL | Freq: Once | INTRAVENOUS | Status: AC
  Administered 2012-09-01: 1000 mL via INTRAVENOUS

## 2012-09-01 MED ORDER — DEXTROSE-NACL 5-0.45 % IV SOLN
INTRAVENOUS | Status: DC
  Administered 2012-09-01: 11:00:00 via INTRAVENOUS

## 2012-09-01 MED ORDER — ROPINIROLE HCL 1 MG PO TABS
3.0000 mg | ORAL_TABLET | Freq: Every day | ORAL | Status: DC
  Administered 2012-09-02 (×2): 3 mg via ORAL
  Filled 2012-09-01 (×5): qty 3

## 2012-09-01 MED ORDER — PANTOPRAZOLE SODIUM 40 MG PO TBEC
40.0000 mg | DELAYED_RELEASE_TABLET | Freq: Every day | ORAL | Status: DC
  Administered 2012-09-01 – 2012-09-02 (×2): 40 mg via ORAL
  Filled 2012-09-01 (×2): qty 1

## 2012-09-01 MED ORDER — ONDANSETRON HCL 4 MG PO TABS
4.0000 mg | ORAL_TABLET | Freq: Three times a day (TID) | ORAL | Status: DC | PRN
  Filled 2012-09-01: qty 1

## 2012-09-01 NOTE — ED Notes (Signed)
Poison control called and checked on pt. They will call back later tonight

## 2012-09-01 NOTE — ED Notes (Signed)
Per EMS pt found unresponsive laying in a recliner by his wife, unknown time, wife states at work all night, states small smell of ETOH, states has had SI/SA in past by OD, gun and knife found and given to [employer] and security. CBG 90, b/p 110/70 p-70. IV placed to lt hand 18g. Respiratory rate 14 regular and unlabored.

## 2012-09-01 NOTE — ED Notes (Signed)
Gena from poison control recommends blood draw for CK and to closely monitor. Expect to see some hypotension but not bradycardia. Gena reports Klonopin does not show up on drug screen test because newer benzo drug, so pt must have other benzos on board.   Gena will call back after meeting, reports to monitor closely.

## 2012-09-01 NOTE — ED Notes (Signed)
Wife Olegario Messier [phone], will call wife with updates. Wife at bedside talking/sitting with pt at present.

## 2012-09-01 NOTE — ED Notes (Signed)
Patient refusing to eat. Stating he wants to die so he will not eat.

## 2012-09-01 NOTE — ED Notes (Signed)
md made aware of pts CBG

## 2012-09-01 NOTE — ED Notes (Signed)
Was reported to rn that wife was given gun and knife and taking them home to put in safe.

## 2012-09-01 NOTE — Progress Notes (Signed)
CSW spoke with EDP, for clarification, pt being moved to psych ed for telepsych and act assessment.   Doree Albee  [phone] 09/01/2012 8:51pm

## 2012-09-01 NOTE — ED Notes (Signed)
Pt will awaken to verbal stimuli. Pt refusing to eat and drink. Pt did drink 1 sip of PO fluids upon rn request. Pt reports he does not think he can walk to bathroom.

## 2012-09-01 NOTE — ED Notes (Signed)
While drawing pt's blood pt woke up and stated he was tired of living. Stated he wanted to die. Stated he took around 70 pills at midnight. Reported to RN.

## 2012-09-01 NOTE — ED Provider Notes (Addendum)
Pt medically cleared at this time. Still somewhat drowsy but awakens to conversational voice and answering appropriately. Mild hypoglycemia around 60. Pt has been in ED for ~12 hours without eating. Not diabetic. Likely his normal fasting glucose level. Pt refusing to eat at this time, but depressed and not reason to continue to monitor in ED or admit. Benzo overdose shouldn't make hypoglycemic.   Raeford Razor, MD 09/01/12 2117   Pt personally ambulated. Still somewhat unsteady but ambulating mostly unassisted.   Raeford Razor, MD 09/01/12 2207

## 2012-09-01 NOTE — ED Notes (Signed)
Telepsych called in.

## 2012-09-01 NOTE — ED Notes (Signed)
Wife called and reported pt had all his clothes laid out that he wanted to be buried in. Wife given report.

## 2012-09-01 NOTE — ED Notes (Signed)
md made aware of poison controls recommendations and pts vital signs

## 2012-09-01 NOTE — ED Provider Notes (Signed)
History     CSN: [id number]  Arrival date & time 09/01/12  0865   First MD Initiated Contact with Patient 09/01/12 [phone]      Chief Complaint  Patient presents with  . Drug Overdose    (Consider location/radiation/quality/duration/timing/severity/associated sxs/prior treatment) HPI Comments: David Solis is a 62 y.o. Male who was found unresponsive by his wife, when she gets home from work this morning. His wife noticed that he was missing some Klonopin from his pill bottle. She reports, that he's been increasingly depressed over the last several days. He has history of prior overdose. The patient is obtunded and cannot give history.   Level V Caveat- altered mental status  Patient is a 62 y.o. male presenting with Overdose. The history is provided by the patient.  Drug Overdose    Past Medical History  Diagnosis Date  . Depression   . Duodenal ulcer disease   . Restless leg   . Neuropathy     in legs  . Asthma   . GERD (gastroesophageal reflux disease)     Past Surgical History  Procedure Laterality Date  . Cholecystectomy    . Cervical fusion    . Back surgery      cervical fusion   . Esophagogastroduodenoscopy  05/10/2012    Procedure: ESOPHAGOGASTRODUODENOSCOPY (EGD);  Surgeon: Theda Belfast, MD;  Location: [facility];  Service: Endoscopy;  Laterality: N/A;    Family History  Problem Relation Age of Onset  . Cancer Mother     breast    History  Substance Use Topics  . Smoking status: Former Smoker    Quit date: 05/09/1994  . Smokeless tobacco: Never Used  . Alcohol Use: No      Review of Systems  Unable to perform ROS   Allergies  Review of patient's allergies indicates no known allergies.  Home Medications   Current Outpatient Rx  Name  Route  Sig  Dispense  Refill  . clonazePAM (KLONOPIN) 0.5 MG tablet   Oral   Take 0.5-1 mg by mouth at bedtime as needed (sleep).         Marland Kitchen desvenlafaxine (PRISTIQ) 50 MG 24 hr tablet   Oral  Take 50 mg by mouth daily.         Marland Kitchen gabapentin (NEURONTIN) 300 MG capsule   Oral   Take 300 mg by mouth 3 (three) times daily.          Marland Kitchen omeprazole (PRILOSEC) 40 MG capsule   Oral   Take 40 mg by mouth daily.         Marland Kitchen rOPINIRole (REQUIP) 3 MG tablet   Oral   Take 3 mg by mouth at bedtime.           BP 102/61  Pulse 59  Temp(Src) 97.7 F (36.5 C) (Oral)  Resp 15  SpO2 97%  Physical Exam  Nursing note and vitals reviewed. Constitutional: He is oriented to person, place, and time. He appears well-developed and well-nourished.  HENT:  Head: Normocephalic and atraumatic.  Right Ear: External ear normal.  Left Ear: External ear normal.  Eyes: Conjunctivae and EOM are normal. Pupils are equal, round, and reactive to light.  Neck: Normal range of motion and phonation normal. Neck supple.  Cardiovascular: Normal rate, regular rhythm, normal heart sounds and intact distal pulses.   Pulmonary/Chest: Effort normal and breath sounds normal. He exhibits no bony tenderness.  Abdominal: Soft. Normal appearance. There is no tenderness.  Musculoskeletal: Normal range  of motion.  Neurological: He is alert and oriented to person, place, and time. He has normal strength. No cranial nerve deficit or sensory deficit. He exhibits normal muscle tone. Coordination normal.  Lethargic. Opens eyes to voice. Gag reflex present. Does not follow commands.  Skin: Skin is warm, dry and intact.  Psychiatric:  Obtunded    ED Course  Procedures (including critical care time)  10:50- is more alert with decreased responsiveness to voice  ED treatment: IV fluids, observation, cardiac monitoring     Date: 04/01/2012  Rate: 72  Rhythm: normal sinus rhythm  QRS Axis: normal  PR and QT Intervals: normal  ST/T Wave abnormalities: normal  PR and QRS Conduction Disutrbances:none  Narrative Interpretation:   Old EKG Reviewed: none available   13:20- Poison control has been contacted. They  feel that since his heart rate and BP are trending down, to  95/62 and 60; that he should be evaluated for a cardiac problem with cardiac enzymes. These have been ordered. IV fluids are being administered; he will be given a bolus of 1 L normal saline. There is no evidence for congestive heart failure.  13:40- patient is having his blood drawn. He did not react to that. I placed myself against his nose, and he immediately woke up and push my hand away. I then insisted that he talk and he did. He was somewhat dysarthric. He states that he took an overdose to kill himself. He did not give additional history, at this time.  Plan as of 13:45- the patient continued to sober up and get a psychiatric consultation. He is currently voluntary.  15:40- he arouses to voice and shaking. He's been evaluated for 6 hours without worsening, and is gradually more responsive. Time of  ingestion of the Klonopin overdose, is unknown. He'll be moved to the psychiatric ED, and we will obtain a pill a psychiatric consultation. He remains voluntary.        Labs Reviewed  URINALYSIS, ROUTINE W REFLEX MICROSCOPIC - Abnormal; Notable for the following:    APPearance CLOUDY (*)    All other components within normal limits  URINE RAPID DRUG SCREEN (HOSP PERFORMED) - Abnormal; Notable for the following:    Benzodiazepines POSITIVE (*)    All other components within normal limits  COMPREHENSIVE METABOLIC PANEL - Abnormal; Notable for the following:    Albumin 3.4 (*)    GFR calc non Af Amer 66 (*)    GFR calc Af Amer 77 (*)    All other components within normal limits  SALICYLATE LEVEL - Abnormal; Notable for the following:    Salicylate Lvl <2.0 (*)    All other components within normal limits  GLUCOSE, CAPILLARY - Abnormal; Notable for the following:    Glucose-Capillary 100 (*)    All other components within normal limits  GLUCOSE, CAPILLARY - Abnormal; Notable for the following:    Glucose-Capillary 102 (*)     All other components within normal limits  GLUCOSE, CAPILLARY - Abnormal; Notable for the following:    Glucose-Capillary 104 (*)    All other components within normal limits  GLUCOSE, CAPILLARY - Abnormal; Notable for the following:    Glucose-Capillary 60 (*)    All other components within normal limits  GLUCOSE, CAPILLARY - Abnormal; Notable for the following:    Glucose-Capillary 58 (*)    All other components within normal limits  GLUCOSE, CAPILLARY - Abnormal; Notable for the following:    Glucose-Capillary 62 (*)  All other components within normal limits  CBC WITH DIFFERENTIAL  ACETAMINOPHEN LEVEL  ETHANOL  GLUCOSE, CAPILLARY  TROPONIN I  CK TOTAL AND CKMB  GLUCOSE, CAPILLARY  GLUCOSE, CAPILLARY  GLUCOSE, CAPILLARY  GLUCOSE, CAPILLARY   Dg Chest Portable 1 View  09/01/2012  *RADIOLOGY REPORT*  Clinical Data: Drug overdose.  PORTABLE CHEST - 1 VIEW  Comparison: 05/09/2012.  Findings: Heart is borderline enlarged but stable.  The mediastinal and hilar contours are unchanged.  Low lung volumes with mild vascular crowding and streaky areas of atelectasis but no infiltrates, edema or effusions.  No pneumothorax.  The bony thorax is intact.  IMPRESSION: Low lung volumes with vascular crowding but no infiltrates or effusions.   Original Report Authenticated By: Rudie Meyer, M.D.      1. Intentional clonazepam overdose, initial encounter   2. Depression       MDM  Intentional Klonipin Overdose. Pt will be seen by Psychiatry when improved.    Care to Dr Juleen China at change of shift      Flint Melter, MD 09/01/12 2114

## 2012-09-01 NOTE — ED Notes (Addendum)
Poison control called and given report. Gena from poison control reports supportive care and keep on the monitor.   Reports if pt took overdose of pristique pt will have to be monitored for 24 hours. Will get exact number and date filled and if suspected pt took more than 1/ day rn will call back to poison control.

## 2012-09-01 NOTE — ED Notes (Signed)
Per EMS pt has 40.5mg s of klonopin missing from bottle

## 2012-09-01 NOTE — ED Notes (Signed)
RUE:AVWU<JW> Expected date:<BR> Expected time:<BR> Means of arrival:<BR> Comments:<BR> Unresponsive Overdose

## 2012-09-01 NOTE — ED Notes (Signed)
Pt CBG registered 70.  Did not cross over.

## 2012-09-01 NOTE — ED Notes (Addendum)
Patient medications-  -Gabapentin 300mg , 1 capsule 3xday, filled 08/04/12, quantity 90 when filled, report of no medication in bottle on arrival.   -Klonopin 0.5mg , take 1-2 hs, quantity 60 when filled on 08/22/12, wife reports patient took at least 40 capsules, no medication in bottle on arrival.  -Omeprazole 40mg , 1 capsule QD, quantity 90 when filled on 03/16/12. -Ropinirole 1 capsule QD, quantity 90 when filled 12/14/11. -Pristiq 50mg  1 capsule QD, filled 08/19/12, quantity 30 when filled, wife counted 23 left in bottle

## 2012-09-01 NOTE — ED Notes (Signed)
Per md pt to eat food

## 2012-09-01 NOTE — ED Notes (Signed)
Dr Effie Shy made aware of recent CBG results.

## 2012-09-01 NOTE — ED Notes (Signed)
Per md, D5% 1/2 NS to be turned off and check CBG to monitor where blood sugar stays

## 2012-09-02 ENCOUNTER — Emergency Department (HOSPITAL_COMMUNITY): Payer: 59

## 2012-09-02 LAB — CBC WITH DIFFERENTIAL/PLATELET
Basophils Absolute: 0 10*3/uL (ref 0.0–0.1)
Basophils Relative: 0 % (ref 0–1)
Eosinophils Absolute: 0.1 10*3/uL (ref 0.0–0.7)
Eosinophils Relative: 1 % (ref 0–5)
HCT: 42.5 % (ref 39.0–52.0)
Hemoglobin: 13.9 g/dL (ref 13.0–17.0)
Lymphocytes Relative: 17 % (ref 12–46)
Lymphs Abs: 1.3 10*3/uL (ref 0.7–4.0)
MCH: 29.8 pg (ref 26.0–34.0)
MCHC: 32.7 g/dL (ref 30.0–36.0)
MCV: 91.2 fL (ref 78.0–100.0)
Monocytes Absolute: 0.4 10*3/uL (ref 0.1–1.0)
Monocytes Relative: 5 % (ref 3–12)
Neutro Abs: 5.6 10*3/uL (ref 1.7–7.7)
Neutrophils Relative %: 76 % (ref 43–77)
Platelets: 143 10*3/uL — ABNORMAL LOW (ref 150–400)
RBC: 4.66 MIL/uL (ref 4.22–5.81)
RDW: 13.4 % (ref 11.5–15.5)
WBC: 7.3 10*3/uL (ref 4.0–10.5)

## 2012-09-02 LAB — GLUCOSE, CAPILLARY
Glucose-Capillary: 96 mg/dL (ref 70–99)
Glucose-Capillary: 110 mg/dL — ABNORMAL HIGH (ref 70–99)

## 2012-09-02 LAB — COMPREHENSIVE METABOLIC PANEL
AST: 15 U/L (ref 0–37)
Albumin: 3.5 g/dL (ref 3.5–5.2)
Calcium: 9.5 mg/dL (ref 8.4–10.5)
Chloride: 102 mEq/L (ref 96–112)
Creatinine, Ser: 0.97 mg/dL (ref 0.50–1.35)
Total Protein: 6.6 g/dL (ref 6.0–8.3)

## 2012-09-02 LAB — TROPONIN I: Troponin I: <0.3 ng/mL (ref <0.30)

## 2012-09-02 MED ORDER — IBUPROFEN 600 MG PO TABS: 600.0000 mg | ORAL_TABLET | Freq: Three times a day (TID) | ORAL | Status: DC | PRN

## 2012-09-02 MED ORDER — ONDANSETRON HCL 4 MG PO TABS: 4.0000 mg | ORAL_TABLET | Freq: Three times a day (TID) | ORAL | Status: DC | PRN

## 2012-09-02 NOTE — BH Assessment (Signed)
Assessment Note   David Solis is an 62 y.o. male who was found unresponsive by his wife, when she gets home from work this morning. His spouse immediately called EMS and patient was brought to [facility] for medical clearance. His wife noticed that he was missing some Klonopin from his pill bottle. She reports, that he's been increasingly depressed over the last several days. He has history of prior overdose.   Writer assessed patient and he reported overdosing taking 80 prescribed "sleeping pills". Patient admits that this was a attempt to end his life. He is not able to contract for safety and continues to endorse suicidal thoughts. He was not able to provide the name or dosage of the prescribed sleeping pills. He reports yrs of depression and endless efforts trying various psycotrophics, 3 prior hospitalizations due to suicide attempts, and previous ECT treatment. Patient does not identify any stressors stating, "I just don't want to live"....."My mother and father are gone"....."My grandparents are dead and I would rather be with them". He doesn't identify any supports. Sts that he he lives with his spouse and it's only a living arrangement. He reports hopelessness, worthlessness, no interest in usual pleasures, and limited sleep (approx. 3-4 hours per night). Appetite is appropriate.   Patient denies HI.  He also denies AVH's reporting a previous history. Reports auditory hallucinations in the past and last episode was 1 yr ago.  He denies alcohol and drug use.     Axis I: Major Depression, Recurrent severe without psychotic feature Axis II: Deferred Axis III:  Past Medical History  Diagnosis Date  . Depression   . Duodenal ulcer disease   . Restless leg   . Neuropathy     in legs  . Asthma   . GERD (gastroesophageal reflux disease)    Axis IV: other psychosocial or environmental problems, problems related to social environment, problems with access to health care services and problems  with primary support group Axis V: 31-40 impairment in reality testing  Past Medical History:  Past Medical History  Diagnosis Date  . Depression   . Duodenal ulcer disease   . Restless leg   . Neuropathy     in legs  . Asthma   . GERD (gastroesophageal reflux disease)     Past Surgical History  Procedure Laterality Date  . Cholecystectomy    . Cervical fusion    . Back surgery      cervical fusion   . Esophagogastroduodenoscopy  05/10/2012    Procedure: ESOPHAGOGASTRODUODENOSCOPY (EGD);  Surgeon: Theda Belfast, MD;  Location: [facility];  Service: Endoscopy;  Laterality: N/A;    Family History:  Family History  Problem Relation Age of Onset  . Cancer Mother     breast    Social History:  reports that he quit smoking about 18 years ago. He has never used smokeless tobacco. He reports that he does not drink alcohol or use illicit drugs.  Additional Social History:  Alcohol / Drug Use Pain Medications: See MAR Prescriptions: See MAR Over the Counter: SEE MAR History of alcohol / drug use?: No history of alcohol / drug abuse  CIWA: CIWA-Ar BP: 130/84 mmHg Pulse Rate: 76 COWS:    Allergies: No Known Allergies  Home Medications:  (Not in a hospital admission)  OB/GYN Status:  No LMP for male patient.  General Assessment Data Location of Assessment: [facility] Living Arrangements: Spouse/significant other Can pt return to current living arrangement?: Yes Admission Status: Voluntary Is patient  capable of signing voluntary admission?: Yes Transfer from: Acute Hospital Referral Source: Self/Family/Friend     Risk to self Suicidal Ideation: Yes-Currently Present Suicidal Intent: Yes-Currently Present Is patient at risk for suicide?: Yes Suicidal Plan?: Yes-Currently Present Specify Current Suicidal Plan:  (overdosed taking 80 sleeping pills ) Access to Means: Yes Specify Access to Suicidal Means:  (prescription medications ) Previous Attempts/Gestures:  Yes How many times?:  (2 prior attempts-06/2011 OD and 1999 OD) Other Self Harm Risks:  (none reported) Triggers for Past Attempts:  ("Aggravation, Tired of living this way, No Desire to live") Intentional Self Injurious Behavior: None Family Suicide History: Yes (Mother and (Ma) aunt- chronic depression) Recent stressful life event(s): Other (Comment) (patient does not identify any specific stressos) Persecutory voices/beliefs?: No Depression: Yes Depression Symptoms: Feeling angry/irritable;Feeling worthless/self pity;Loss of interest in usual pleasures;Guilt;Fatigue;Isolating;Despondent;Insomnia;Tearfulness Substance abuse history and/or treatment for substance abuse?: No Suicide prevention information given to non-admitted patients: Not applicable  Risk to Others Homicidal Ideation: No Thoughts of Harm to Others: No Current Homicidal Intent: No Current Homicidal Plan: No Access to Homicidal Means: No Identified Victim:  (n/a) History of harm to others?: No Assessment of Violence: None Noted Violent Behavior Description:  (patient calm and cooperative) Does patient have access to weapons?: No Criminal Charges Pending?: No Does patient have a court date: No  Psychosis Hallucinations: None noted (pt admits to a histoy of aud halluc's-last episode 2 yrs ago) Delusions: None noted  Mental Status Report Appear/Hygiene: Disheveled Eye Contact: Poor Motor Activity: Freedom of movement Speech: Logical/coherent Level of Consciousness: Alert Mood: Depressed;Sad Affect: Sad Anxiety Level: Minimal Thought Processes: Coherent;Relevant Judgement: Impaired Orientation: Person;Place;Time;Situation Obsessive Compulsive Thoughts/Behaviors: None  Cognitive Functioning Concentration: Decreased Memory: Recent Intact;Remote Intact IQ: Average Insight: Poor Impulse Control: Poor Appetite: Good Weight Loss:  (none reported) Weight Gain:  (none reported) Sleep: Decreased Total Hours  of Sleep:  (2-4 hours per night and sometimes awake x3 days) Vegetative Symptoms: None  ADLScreening [facility]) Patient's cognitive ability adequate to safely complete daily activities?: Yes Patient able to express need for assistance with ADLs?: Yes Independently performs ADLs?: Yes (appropriate for developmental age)  Abuse/Neglect [facility]) Physical Abuse: Denies Verbal Abuse: Denies Sexual Abuse: Denies  Prior Inpatient Therapy Prior Inpatient Therapy: Yes Prior Therapy Dates:  ([facility]-3-4 prior admissions) Prior Therapy Facilty/Provider(s):  [facility]) Reason for Treatment:  (depression and sucidal thoughts)  Prior Outpatient Therapy Prior Outpatient Therapy: Yes Prior Therapy Dates:  (current) Prior Therapy Facilty/Provider(s):  (Dr. Chilton Greathouse in [facility]) Reason for Treatment:  (depression and medication managment)  ADL Screening (condition at time of admission) Patient's cognitive ability adequate to safely complete daily activities?: Yes Patient able to express need for assistance with ADLs?: Yes Independently performs ADLs?: Yes (appropriate for developmental age) Weakness of Legs: None Weakness of Arms/Hands: None  Home Assistive Devices/Equipment Home Assistive Devices/Equipment: None    Abuse/Neglect Assessment (Assessment to be complete while patient is alone) Physical Abuse: Denies Verbal Abuse: Denies Sexual Abuse: Denies Exploitation of patient/patient's resources: Denies Self-Neglect: Denies Values / Beliefs Cultural Requests During Hospitalization: None Spiritual Requests During Hospitalization: None   Advance Directives (For Healthcare) Advance Directive: Patient does not have advance directive Nutrition Screen- [facility] Adult/[facility] Patient's home diet: Regular  Additional Information 1:1 In Past 12 Months?: No CIRT Risk: No Elopement Risk: No Does patient have medical clearance?: Yes     Disposition:  Disposition Initial Assessment  Completed for this Encounter: Yes Disposition of Patient: Inpatient treatment program;Referred to (Pt referred  to [facility], [facility], etc for a inpt admission) Type of inpatient treatment program: Adult  On Site Evaluation by:   Reviewed with Physician:     Melynda Ripple [facility] 09/02/2012 4:48 PM

## 2012-09-02 NOTE — ED Provider Notes (Addendum)
Patient reassessed at 9:50 AM. He remains sleepy but arousable oriented and appropriate when asked questions. However nursing staff reports that he is too weak to walk. He has no focal neurological deficits. We'll reassess at 12 noon for potential admission if he is unable to be psychiatrically cleared. Will repeat CT head, repeat labs and EKG.  BP 95/54  Pulse 63  Temp(Src) 97.1 F (36.2 C) (Oral)  Resp 14  SpO2 92%  11:10 AM. Repeat labs, EKG and head CT unremarkable.   Date: 09/02/2012  Rate: 69  Rhythm: normal sinus rhythm  QRS Axis: left  Intervals: normal  ST/T Wave abnormalities: normal  Conduction Disutrbances:none  Narrative Interpretation:   Old EKG Reviewed: unchanged    Glynn Octave, MD 09/02/12 0454  Glynn Octave, MD 09/02/12 1525

## 2012-09-02 NOTE — ED Notes (Signed)
Pt able to get out of bed and ambulate down hallway with 2 staff assist - pt still very lethargic and unsteady. Would require staff assist to ambulate at this time. MD notified.

## 2012-09-02 NOTE — ED Provider Notes (Signed)
PSY consult - Dr Jacky Kindle recommends [facility] admit. ACT involved  Sunnie Nielsen, MD 09/02/12 [phone]

## 2012-09-02 NOTE — ED Notes (Signed)
Pt attempted to ambulate was not steady.  2X stumbling before leaving the room.  2 Pt assist.

## 2012-09-02 NOTE — ED Notes (Signed)
Pt ambulated down hall with 1 staff assist, is more steady on feet than before. Will move to psych ED

## 2012-09-02 NOTE — ED Notes (Signed)
Pt's wife called for updates - does not wish to come to ED at this time. Requests to speak with MD regarding further updates. Wife - Olegario Messier: [phone]

## 2012-09-02 NOTE — ED Notes (Signed)
Poison control called for pt updates

## 2012-09-02 NOTE — ED Notes (Signed)
Sheriff came by and stated pt cannot go to [facility] tonight due to not being served his IVC papers prior to 1900; explained to him we had a new RN working and she didn't know the policy. Called EDP to make him aware pt would transport at 0700. Called [facility] to let them know pt would not come tonight but at 0700 tomorrow. Called AC to let her know as well. Wife called in and spoke to husband about his arrangements so she knows as well. Husband signed a consent for the wife, it's in his chart, she asked to relay a message to him. Unable to reach the ACTT team to let them know.

## 2012-09-02 NOTE — ED Notes (Signed)
Telepsych in progress. 

## 2012-09-02 NOTE — ED Notes (Signed)
Louisa from [employer] called in to follow up on pt. Told her he is doing well. She said they are closing case on their end.

## 2012-09-02 NOTE — ED Notes (Signed)
Pt attempted to sit up/use wheelchair to use bathroom. Stated he felt "too weak" could not stand up or get out of bed. Bed side commode used with 2 staff assist.

## 2012-09-03 DIAGNOSIS — F329 Major depressive disorder, single episode, unspecified: Secondary | ICD-10-CM | POA: Diagnosis not present

## 2012-09-03 DIAGNOSIS — G2581 Restless legs syndrome: Secondary | ICD-10-CM | POA: Diagnosis present

## 2012-09-03 DIAGNOSIS — J45909 Unspecified asthma, uncomplicated: Secondary | ICD-10-CM | POA: Diagnosis present

## 2012-09-03 DIAGNOSIS — R45851 Suicidal ideations: Secondary | ICD-10-CM | POA: Diagnosis not present

## 2012-09-03 DIAGNOSIS — R4182 Altered mental status, unspecified: Secondary | ICD-10-CM | POA: Diagnosis not present

## 2012-09-03 DIAGNOSIS — F3189 Other bipolar disorder: Secondary | ICD-10-CM | POA: Diagnosis present

## 2012-09-03 DIAGNOSIS — S6990XA Unspecified injury of unspecified wrist, hand and finger(s), initial encounter: Secondary | ICD-10-CM | POA: Diagnosis not present

## 2012-09-03 DIAGNOSIS — F332 Major depressive disorder, recurrent severe without psychotic features: Secondary | ICD-10-CM | POA: Diagnosis not present

## 2012-09-03 DIAGNOSIS — Z609 Problem related to social environment, unspecified: Secondary | ICD-10-CM | POA: Diagnosis not present

## 2012-09-03 DIAGNOSIS — G589 Mononeuropathy, unspecified: Secondary | ICD-10-CM | POA: Diagnosis present

## 2012-09-03 DIAGNOSIS — K219 Gastro-esophageal reflux disease without esophagitis: Secondary | ICD-10-CM | POA: Diagnosis present

## 2012-09-03 NOTE — ED Notes (Signed)
Pt up to bathroom.

## 2012-09-03 NOTE — ED Provider Notes (Signed)
Pt accepted to [facility].   David Veasey B. Bernette Mayers, MD 09/03/12 [phone]

## 2012-09-03 NOTE — ED Notes (Signed)
Pt ambulatory w/ sheriff.  Stacey at [facility] updated.

## 2012-09-03 NOTE — ED Notes (Signed)
Called EDP and asked him to put pt up for d/c so that he can transfer to [facility] at 0700.

## 2012-09-03 NOTE — ED Notes (Signed)
Sgt Pascal, Sheriff at [phone], left message on machine with pt info for him to be picked up for transport to [facility] at 0700.

## 2012-09-03 NOTE — ED Notes (Addendum)
Sheriff here to transport-AVS, MAR, transport report, telepsych, ekg, ACT assessment sent w/ pt.  Pt has not belongings.

## 2012-09-03 NOTE — ED Notes (Signed)
i looked in locker 20 David Solis had no belongings i went an looked by the desk at [facility] where he came from in the main ed and there was no clothes i explained to [employer] i could not find any belongings and there wasn't anything recorded in the chart about his clothes.

## 2012-09-15 DIAGNOSIS — F411 Generalized anxiety disorder: Secondary | ICD-10-CM | POA: Diagnosis not present

## 2012-09-15 DIAGNOSIS — F339 Major depressive disorder, recurrent, unspecified: Secondary | ICD-10-CM | POA: Diagnosis not present

## 2012-09-22 DIAGNOSIS — F411 Generalized anxiety disorder: Secondary | ICD-10-CM | POA: Diagnosis not present

## 2012-09-22 DIAGNOSIS — F339 Major depressive disorder, recurrent, unspecified: Secondary | ICD-10-CM | POA: Diagnosis not present

## 2012-10-06 DIAGNOSIS — F339 Major depressive disorder, recurrent, unspecified: Secondary | ICD-10-CM | POA: Diagnosis not present

## 2012-10-06 DIAGNOSIS — F411 Generalized anxiety disorder: Secondary | ICD-10-CM | POA: Diagnosis not present

## 2012-11-24 DIAGNOSIS — F411 Generalized anxiety disorder: Secondary | ICD-10-CM | POA: Diagnosis not present

## 2012-11-24 DIAGNOSIS — F339 Major depressive disorder, recurrent, unspecified: Secondary | ICD-10-CM | POA: Diagnosis not present

## 2013-01-05 DIAGNOSIS — F339 Major depressive disorder, recurrent, unspecified: Secondary | ICD-10-CM | POA: Diagnosis not present

## 2013-01-05 DIAGNOSIS — F411 Generalized anxiety disorder: Secondary | ICD-10-CM | POA: Diagnosis not present

## 2013-03-02 DIAGNOSIS — F411 Generalized anxiety disorder: Secondary | ICD-10-CM | POA: Diagnosis not present

## 2013-03-02 DIAGNOSIS — F339 Major depressive disorder, recurrent, unspecified: Secondary | ICD-10-CM | POA: Diagnosis not present

## 2013-05-23 DIAGNOSIS — F339 Major depressive disorder, recurrent, unspecified: Secondary | ICD-10-CM | POA: Diagnosis not present

## 2013-08-16 DIAGNOSIS — F339 Major depressive disorder, recurrent, unspecified: Secondary | ICD-10-CM | POA: Diagnosis not present

## 2013-10-26 DIAGNOSIS — M25569 Pain in unspecified knee: Secondary | ICD-10-CM | POA: Diagnosis not present

## 2013-12-06 DIAGNOSIS — F339 Major depressive disorder, recurrent, unspecified: Secondary | ICD-10-CM | POA: Diagnosis not present

## 2013-12-06 DIAGNOSIS — F411 Generalized anxiety disorder: Secondary | ICD-10-CM | POA: Diagnosis not present

## 2014-06-21 DIAGNOSIS — L821 Other seborrheic keratosis: Secondary | ICD-10-CM | POA: Diagnosis not present

## 2014-06-21 DIAGNOSIS — L578 Other skin changes due to chronic exposure to nonionizing radiation: Secondary | ICD-10-CM | POA: Diagnosis not present

## 2014-06-21 DIAGNOSIS — L570 Actinic keratosis: Secondary | ICD-10-CM | POA: Diagnosis not present

## 2014-06-21 DIAGNOSIS — D485 Neoplasm of uncertain behavior of skin: Secondary | ICD-10-CM | POA: Diagnosis not present

## 2015-01-14 ENCOUNTER — Emergency Department (INDEPENDENT_AMBULATORY_CARE_PROVIDER_SITE_OTHER): Payer: Medicare Other

## 2015-01-14 ENCOUNTER — Encounter: Payer: Self-pay | Admitting: *Deleted

## 2015-01-14 ENCOUNTER — Emergency Department (INDEPENDENT_AMBULATORY_CARE_PROVIDER_SITE_OTHER)
Admission: EM | Admit: 2015-01-14 | Discharge: 2015-01-14 | Disposition: A | Discharge status: Home / Self Care | Payer: No Typology Code available for payment source | Source: Home / Self Care | Attending: Family Medicine | Admitting: Family Medicine

## 2015-01-14 DIAGNOSIS — S8391XA Sprain of unspecified site of right knee, initial encounter: Secondary | ICD-10-CM | POA: Diagnosis not present

## 2015-01-14 DIAGNOSIS — M25561 Pain in right knee: Secondary | ICD-10-CM

## 2015-01-14 DIAGNOSIS — M25462 Effusion, left knee: Secondary | ICD-10-CM

## 2015-01-14 DIAGNOSIS — M25461 Effusion, right knee: Secondary | ICD-10-CM | POA: Diagnosis not present

## 2015-01-14 MED ORDER — MELOXICAM 15 MG PO TABS: 15.0000 mg | ORAL_TABLET | Freq: Every day | ORAL | Status: DC

## 2015-01-14 MED ORDER — HYDROCODONE-ACETAMINOPHEN 5-325 MG PO TABS: ORAL_TABLET | ORAL | Status: DC

## 2015-01-14 NOTE — Discharge Instructions (Signed)
Apply ice pack for 30 minutes 3 to 4 times daily until swelling decreases.  Elevate.  Wear brace for about 2 to 3 weeks.  Begin range of motion and stretching exercises in about 5 days as per instruction sheet.    Knee Effusion The medical term for having fluid in your knee is effusion. This is often due to an internal derangement of the knee. This means something is wrong inside the knee. Some of the causes of fluid in the knee may be torn cartilage, a torn ligament, or bleeding into the joint from an injury. Your knee is likely more difficult to bend and move. This is often because there is increased pain and pressure in the joint. The time it takes for recovery from a knee effusion depends on different factors, including:   Type of injury.  Your age.  Physical and medical conditions.  Rehabilitation Strategies. How long you will be away from your normal activities will depend on what kind of knee problem you have and how much damage is present. Your knee has two types of cartilage. Articular cartilage covers the bone ends and lets your knee bend and move smoothly. Two menisci, thick pads of cartilage that form a rim inside the joint, help absorb shock and stabilize your knee. Ligaments bind the bones together and support your knee joint. Muscles move the joint, help support your knee, and take stress off the joint itself. CAUSES  Often an effusion in the knee is caused by an injury to one of the menisci. This is often a tear in the cartilage. Recovery after a meniscus injury depends on how much meniscus is damaged and whether you have damaged other knee tissue. Small tears may heal on their own with conservative treatment. Conservative means rest, limited weight bearing activity and muscle strengthening exercises. Your recovery may take up to 6 weeks.  TREATMENT  Larger tears may require surgery. Meniscus injuries may be treated during arthroscopy. Arthroscopy is a procedure in which your surgeon  uses a small telescope like instrument to look in your knee. Your caregiver can make a more accurate diagnosis (learning what is wrong) by performing an arthroscopic procedure. If your injury is on the inner margin of the meniscus, your surgeon may trim the meniscus back to a smooth rim. In other cases your surgeon will try to repair a damaged meniscus with stitches (sutures). This may make rehabilitation take longer, but may provide better long term result by helping your knee keep its shock absorption capabilities. Ligaments which are completely torn usually require surgery for repair. HOME CARE INSTRUCTIONS  Use crutches as instructed.  If a brace is applied, use as directed.  Once you are home, an ice pack applied to your swollen knee may help with discomfort and help decrease swelling.  Keep your knee raised (elevated) when you are not up and around or on crutches.  Only take over-the-counter or prescription medicines for pain, discomfort, or fever as directed by your caregiver.  Your caregivers will help with instructions for rehabilitation of your knee. This often includes strengthening exercises.  You may resume a normal diet and activities as directed. SEEK MEDICAL CARE IF:   There is increased swelling in your knee.  You notice redness, swelling, or increasing pain in your knee.  An unexplained oral temperature above 102 F (38.9 C) develops. SEEK IMMEDIATE MEDICAL CARE IF:   You develop a rash.  You have difficulty breathing.  You have any allergic reactions from medications  you may have been given.  There is severe pain with any motion of the knee. MAKE SURE YOU:   Understand these instructions.  Will watch your condition.  Will get help right away if you are not doing well or get worse. Document Released: 08/22/2003 Document Revised: 08/24/2011 Document Reviewed: 10/26/2007 [facility] Patient Information 2015 [place], [place]. This information is not intended to  replace advice given to you by your health care provider. Make sure you discuss any questions you have with your health care provider.   Knee Sprain A knee sprain is a tear in one of the strong, fibrous tissues that connect the bones (ligaments) in your knee. The severity of the sprain depends on how much of the ligament is torn. The tear can be either partial or complete. CAUSES  Often, sprains are a result of a fall or injury. The force of the impact causes the fibers of your ligament to stretch too much. This excess tension causes the fibers of your ligament to tear. SIGNS AND SYMPTOMS  You may have some loss of motion in your knee. Other symptoms include:  Bruising.  Pain in the knee area.  Tenderness of the knee to the touch.  Swelling. DIAGNOSIS  To diagnose a knee sprain, your health care provider will physically examine your knee. Your health care provider may also suggest an X-ray exam of your knee to make sure no bones are broken. TREATMENT  If your ligament is only partially torn, treatment usually involves keeping the knee in a fixed position (immobilization) or bracing your knee for activities that require movement for several weeks. To do this, your health care provider will apply a bandage, cast, or splint to keep your knee from moving and to support your knee during movement until it heals. For a partially torn ligament, the healing process usually takes 4-6 weeks. If your ligament is completely torn, depending on which ligament it is, you may need surgery to reconnect the ligament to the bone or reconstruct it. After surgery, a cast or splint may be applied and will need to stay on your knee for 4-6 weeks while your ligament heals. HOME CARE INSTRUCTIONS  Keep your injured knee elevated to decrease swelling.  To ease pain and swelling, apply ice to the injured area:  Put ice in a plastic bag.  Place a towel between your skin and the bag.  Leave the ice on for 20  minutes, 2-3 times a day.  Only take medicine for pain as directed by your health care provider.  Do not leave your knee unprotected until pain and stiffness go away (usually 4-6 weeks).  If you have a cast or splint, do not allow it to get wet. If you have been instructed not to remove it, cover it with a plastic bag when you shower or bathe. Do not swim.  Your health care provider may suggest exercises for you to do during your recovery to prevent or limit permanent weakness and stiffness. SEEK IMMEDIATE MEDICAL CARE IF:  Your cast or splint becomes damaged.  Your pain becomes worse.  You have significant pain, swelling, or numbness below the cast or splint. MAKE SURE YOU:  Understand these instructions.  Will watch your condition.  Will get help right away if you are not doing well or get worse. Document Released: 06/01/2005 Document Revised: 03/22/2013 Document Reviewed: 01/11/2013 [facility] Patient Information 2015 [place], [place]. This information is not intended to replace advice given to you by your health care  provider. Make sure you discuss any questions you have with your health care provider. ° °

## 2015-01-14 NOTE — ED Notes (Signed)
Pt reports popping and twisting right knee 3 days ago while exercising, stepping side to side during martial arts.

## 2015-01-14 NOTE — ED Provider Notes (Signed)
CSN: [id number]     Arrival date & time 01/14/15  1124 History   First MD Initiated Contact with Patient 01/14/15 1204     Chief Complaint  Patient presents with  . Knee Injury      HPI Comments: During a martial arts class three days ago, patient was stepping rapidly from side to side when he felt and heard a popping sensation in his right knee.  He has noted swelling in the popliteal area.  His pain is worse when bearing weight and twisting his right knee. He has a past history of right medial compartment DJD, having had a knee injection approximately one year ago.  Patient is a 64 y.o. male presenting with knee pain. The history is provided by the patient.  Knee Pain Location:  Knee Time since incident:  3 days Injury: yes   Mechanism of injury comment:  Twisted right knee Knee location:  R knee Pain details:    Quality:  Aching   Radiates to:  Does not radiate   Severity:  Moderate   Onset quality:  Sudden   Duration:  3 days   Timing:  Constant   Progression:  Unchanged Chronicity:  New Prior injury to area:  No Relieved by:  Nothing Worsened by:  Bearing weight, flexion and rotation Ineffective treatments:  NSAIDs Associated symptoms: decreased ROM, stiffness and swelling   Associated symptoms: no muscle weakness, no numbness and no tingling   Risk factors: obesity     Past Medical History  Diagnosis Date  . Depression   . Duodenal ulcer disease   . Restless leg   . Neuropathy     in legs  . Asthma   . GERD (gastroesophageal reflux disease)    Past Surgical History  Procedure Laterality Date  . Cholecystectomy    . Cervical fusion    . Back surgery      cervical fusion   . Esophagogastroduodenoscopy  05/10/2012    Procedure: ESOPHAGOGASTRODUODENOSCOPY (EGD);  Surgeon: Beryle Beams, MD;  Location: [facility];  Service: Endoscopy;  Laterality: N/A;   Family History  Problem Relation Age of Onset  . Cancer Mother     breast   History  Substance Use  Topics  . Smoking status: Former Smoker    Quit date: 05/09/1994  . Smokeless tobacco: Never Used  . Alcohol Use: No    Review of Systems  Musculoskeletal: Positive for stiffness.  All other systems reviewed and are negative.   Allergies  Review of patient's allergies indicates no known allergies.  Home Medications   Prior to Admission medications   Medication Sig Start Date End Date Taking? Authorizing Provider  clonazePAM (KLONOPIN) 0.5 MG tablet Take 0.5-1 mg by mouth at bedtime as needed (sleep).    Historical Provider, MD  desvenlafaxine (PRISTIQ) 50 MG 24 hr tablet Take 50 mg by mouth daily.    Historical Provider, MD  gabapentin (NEURONTIN) 300 MG capsule Take 300 mg by mouth 3 (three) times daily.     Historical Provider, MD  HYDROcodone-acetaminophen (NORCO/VICODIN) 5-325 MG per tablet Take one by mouth at bedtime as needed for pain 01/14/15   [name], MD  meloxicam (MOBIC) 15 MG tablet Take 1 tablet (15 mg total) by mouth daily. Take with food each morning 01/14/15   [name], MD  omeprazole (PRILOSEC) 40 MG capsule Take 40 mg by mouth daily.    Historical Provider, MD  rOPINIRole (REQUIP) 3 MG tablet Take 3 mg  by mouth at bedtime.    Historical Provider, MD   BP 149/85 mmHg  Pulse 64  Resp 16  Ht 5' 10.5" (1.791 m)  Wt 235 lb (106.595 kg)  BMI 33.23 kg/m2  SpO2 96% Physical Exam  Constitutional: He is oriented to person, place, and time. He appears well-developed and well-nourished. No distress.  Patient is obese (BMI 33.2)  HENT:  Head: Atraumatic.  Eyes: Pupils are equal, round, and reactive to light.  Neck: Normal range of motion.  Cardiovascular: Normal heart sounds.   Pulmonary/Chest: Breath sounds normal.  Musculoskeletal: He exhibits no edema.       Right knee: He exhibits decreased range of motion, swelling and bony tenderness. He exhibits no ecchymosis, no deformity, no laceration, no erythema, normal alignment and no LCL laxity. Tenderness  found. Medial joint line and lateral joint line tenderness noted. No patellar tendon tenderness noted.       Legs: Patient has difficulty fully extending knee; unable to perform McMurray test.  Drawer test negative.  There is medial and lateral joint line tenderness.   Neurological: He is alert and oriented to person, place, and time.  Skin: Skin is warm and dry.  Nursing note and vitals reviewed.   ED Course  Procedures  none  Imaging Review Dg Knee Complete 4 Views Right  01/14/2015   CLINICAL DATA:  Anterior and posterior RIGHT knee pain after dodging back and forth during exercise 3 days ago  EXAM: RIGHT KNEE - COMPLETE 4+ VIEW  COMPARISON:  None  FINDINGS: Osseous mineralization normal.  Joint spaces preserved.  No acute fracture, dislocation or bone destruction.  Small knee joint effusion.  Remaining soft tissues unremarkable.  Tiny patellar spur at quadriceps tendon insertion.  IMPRESSION: Small joint effusion.  No acute osseous findings.   Electronically Signed   By: Lavonia Dana M.D.   On: 01/14/2015 13:06     MDM   1. Right knee sprain, initial encounter; suspect meniscus injury    Begin Mobic 15mg  daily.  Rx for Lortab at bedtime prn. Hinged knee brace applied.  Apply ice pack for 30 minutes 3 to 4 times daily until swelling decreases.  Elevate.  Wear brace for about 2 to 3 weeks.  Begin range of motion and stretching exercises in about 5 days as per instruction sheet.  Followup with Dr. Aundria Mems or Dr. Lynne Leader ([facility]) if not improving about two weeks.     [name], MD 01/14/15 [phone]

## 2015-02-04 DIAGNOSIS — M25561 Pain in right knee: Secondary | ICD-10-CM | POA: Diagnosis not present

## 2015-05-15 DIAGNOSIS — M25561 Pain in right knee: Secondary | ICD-10-CM | POA: Diagnosis not present

## 2015-06-25 DIAGNOSIS — F330 Major depressive disorder, recurrent, mild: Secondary | ICD-10-CM | POA: Diagnosis not present

## 2015-06-27 MED FILL — lamoTRIgine 100 MG TABS: 100 | 30 days supply | Qty: 30 | Fill #3

## 2015-06-27 MED FILL — MIRTAZAPINE 15 MG TABLET: 15 | 30 days supply | Qty: 30 | Fill #0

## 2015-06-27 MED FILL — traZODone HCL 100 MG TABS: 100 | 30 days supply | Qty: 75 | Fill #0

## 2015-07-05 MED FILL — GABAPENTIN 300 MG CAPSULE: 300 | 30 days supply | Qty: 90 | Fill #5

## 2015-07-05 MED FILL — GABAPENTIN 400 MG CAPSULE: 400 | 30 days supply | Qty: 90 | Fill #0

## 2015-07-29 MED FILL — lamoTRIgine 100 MG TABS: 100 | 30 days supply | Qty: 30 | Fill #4

## 2015-07-29 MED FILL — MIRTAZAPINE 15 MG TABLET: 15 | 30 days supply | Qty: 30 | Fill #1

## 2015-07-29 MED FILL — traZODone HCL 100 MG TABS: 100 | 30 days supply | Qty: 75 | Fill #1

## 2015-08-03 ENCOUNTER — Emergency Department
Admission: EM | Admit: 2015-08-03 | Discharge: 2015-08-03 | Disposition: A | Discharge status: Home / Self Care | Payer: Medicare Other | Source: Home / Self Care | Attending: Family Medicine | Admitting: Family Medicine

## 2015-08-03 ENCOUNTER — Encounter: Payer: Self-pay | Admitting: Emergency Medicine

## 2015-08-03 DIAGNOSIS — J111 Influenza due to unidentified influenza virus with other respiratory manifestations: Secondary | ICD-10-CM

## 2015-08-03 DIAGNOSIS — R69 Illness, unspecified: Principal | ICD-10-CM

## 2015-08-03 LAB — POCT RAPID STREP A (OFFICE): RAPID STREP A SCREEN: NEGATIVE

## 2015-08-03 MED ORDER — BENZONATATE 200 MG PO CAPS: 200.0000 mg | ORAL_CAPSULE | Freq: Every day | ORAL | Status: DC

## 2015-08-03 MED ORDER — OSELTAMIVIR PHOSPHATE 75 MG PO CAPS: 75.0000 mg | ORAL_CAPSULE | Freq: Two times a day (BID) | ORAL | Status: DC

## 2015-08-03 NOTE — ED Notes (Signed)
Pt c/o sore throat x 3 days, left ear pain, no fever, cough, congestion and runny nose.

## 2015-08-03 NOTE — Discharge Instructions (Signed)
Take plain guaifenesin (1200mg  extended release tabs such as Mucinex) twice daily, with plenty of water, for cough and congestion.  May add Pseudoephedrine (30mg , one or two every 4 to 6 hours) for sinus congestion.  Get adequate rest.   May use Afrin nasal spray (or generic oxymetazoline) twice daily for about 5 days and then discontinue.  Also recommend using saline nasal spray several times daily and saline nasal irrigation (AYR is a common brand).   Try warm salt water gargles for sore throat.  Stop all antihistamines for now, and other non-prescription cough/cold preparations. May take Ibuprofen 200mg , 4 tabs every 8 hours with food for body aches, headache, etc.   Follow-up with family doctor if not improving about 4 to 5 days.   Influenza, Adult Influenza ("the flu") is a viral infection of the respiratory tract. It occurs more often in winter months because people spend more time in close contact with one another. Influenza can make you feel very sick. Influenza easily spreads from person to person (contagious). CAUSES  Influenza is caused by a virus that infects the respiratory tract. You can catch the virus by breathing in droplets from an infected person's cough or sneeze. You can also catch the virus by touching something that was recently contaminated with the virus and then touching your mouth, nose, or eyes. RISKS AND COMPLICATIONS You may be at risk for a more severe case of influenza if you smoke cigarettes, have diabetes, have chronic heart disease (such as heart failure) or lung disease (such as asthma), or if you have a weakened immune system. Elderly people and pregnant women are also at risk for more serious infections. The most common problem of influenza is a lung infection (pneumonia). Sometimes, this problem can require emergency medical care and may be life threatening. SIGNS AND SYMPTOMS  Symptoms typically last 4 to 10 days and may  include:  Fever.  Chills.  Headache, body aches, and muscle aches.  Sore throat.  Chest discomfort and cough.  Poor appetite.  Weakness or feeling tired.  Dizziness.  Nausea or vomiting. DIAGNOSIS  Diagnosis of influenza is often made based on your history and a physical exam. A nose or throat swab test can be done to confirm the diagnosis. TREATMENT  In mild cases, influenza goes away on its own. Treatment is directed at relieving symptoms. For more severe cases, your health care provider may prescribe antiviral medicines to shorten the sickness. Antibiotic medicines are not effective because the infection is caused by a virus, not by bacteria. HOME CARE INSTRUCTIONS  Take medicines only as directed by your health care provider.  Use a cool mist humidifier to make breathing easier.  Get plenty of rest until your temperature returns to normal. This usually takes 3 to 4 days.  Drink enough fluid to keep your urine clear or pale yellow.  Cover yourmouth and nosewhen coughing or sneezing,and wash your handswellto prevent thevirusfrom spreading.  Stay homefromwork orschool untilthe fever is gonefor at least 48full day. PREVENTION  An annual influenza vaccination (flu shot) is the best way to avoid getting influenza. An annual flu shot is now routinely recommended for all adults in the [place] IF:  You experiencechest pain, yourcough worsens,or you producemore mucus.  Youhave nausea,vomiting, ordiarrhea.  Your fever returns or gets worse. SEEK IMMEDIATE MEDICAL CARE IF:  You havetrouble breathing, you become short of breath,or your skin ornails becomebluish.  You have severe painor stiffnessin the neck.  You develop a  sudden headache, or pain in the face or ear.  You have nausea or vomiting that you cannot control. MAKE SURE YOU:   Understand these instructions.  Will watch your condition.  Will get help right away if you  are not doing well or get worse.   This information is not intended to replace advice given to you by your health care provider. Make sure you discuss any questions you have with your health care provider.   Document Released: 05/29/2000 Document Revised: 06/22/2014 Document Reviewed: 08/31/2011 Elsevier Interactive Patient Education [employer].

## 2015-08-03 NOTE — ED Provider Notes (Signed)
CSN: [id number]     Arrival date & time 08/03/15  1635 History   First MD Initiated Contact with Patient 08/03/15 1823     Chief Complaint  Patient presents with  . Otalgia      HPI Comments: Complains of 3 day history flu-like illness including myalgias, headache, fever/chills, fatigue, and cough.  Also has mild nasal congestion and sore throat.  Cough is non-productive and somewhat worse at night.  No pleuritic pain, but he complains of wheezing and shortness of breath with activity.      The history is provided by the patient.    Past Medical History  Diagnosis Date  . Depression   . Duodenal ulcer disease   . Restless leg   . Neuropathy (HCC)     in legs  . Asthma   . GERD (gastroesophageal reflux disease)    Past Surgical History  Procedure Laterality Date  . Cholecystectomy    . Cervical fusion    . Back surgery      cervical fusion   . Esophagogastroduodenoscopy  05/10/2012    Procedure: ESOPHAGOGASTRODUODENOSCOPY (EGD);  Surgeon: Beryle Beams, MD;  Location: [facility];  Service: Endoscopy;  Laterality: N/A;   Family History  Problem Relation Age of Onset  . Cancer Mother     breast   Social History  Substance Use Topics  . Smoking status: Former Smoker    Quit date: 05/09/1994  . Smokeless tobacco: Never Used  . Alcohol Use: No    Review of Systems + sore throat + hoarse + cough No pleuritic pain + wheezing + nasal congestion + post-nasal drainage No sinus pain/pressure No itchy/red eyes ? earache No hemoptysis + SOB + fever, + chills No nausea No vomiting No abdominal pain No diarrhea No urinary symptoms No skin rash + fatigue + myalgias + headache Used OTC meds without relief  Allergies  Review of patient's allergies indicates no known allergies.  Home Medications   Prior to Admission medications   Medication Sig Start Date End Date Taking? Authorizing Provider  LamoTRIgine (LAMICTAL PO) Take by mouth.   Yes Historical  Provider, MD  Mirtazapine (REMERON PO) Take by mouth.   Yes Historical Provider, MD  TRAZODONE HCL PO Take by mouth.   Yes Historical Provider, MD  benzonatate (TESSALON) 200 MG capsule Take 1 capsule (200 mg total) by mouth at bedtime. Take as needed for cough 08/03/15   [name], MD  gabapentin (NEURONTIN) 300 MG capsule Take 300 mg by mouth 3 (three) times daily.     Historical Provider, MD  oseltamivir (TAMIFLU) 75 MG capsule Take 1 capsule (75 mg total) by mouth every 12 (twelve) hours. 08/03/15   [name], MD   Meds Ordered and Administered this Visit  Medications - No data to display  BP 136/79 mmHg  Pulse 97  Temp(Src) 98.5 F (36.9 C) (Oral)  Ht 5\' 10"  (1.778 m)  Wt 231 lb (104.781 kg)  BMI 33.15 kg/m2  SpO2 92% No data found.   Physical Exam Nursing notes and Vital Signs reviewed. Appearance:  Patient appears stated age, and in no acute distress.  Patient is obese (BMI 33.2) Eyes:  Pupils are equal, round, and reactive to light and accomodation.  Extraocular movement is intact.  Conjunctivae are not inflamed  Ears:  Canals normal.  Tympanic membranes normal.  Nose:  Mildly congested turbinates.  No sinus tenderness.   Pharynx:  Mildly erythematous Neck:  Supple.  Tender tonsillar  nodes, and nontender enlarged posterior nodes are palpated bilaterally  Lungs:  Clear to auscultation.  Breath sounds are equal.  Moving air well. Heart:  Regular rate and rhythm without murmurs, rubs, or gallops.  Abdomen:  Nontender without masses or hepatosplenomegaly.  Bowel sounds are present.  No CVA or flank tenderness.  Extremities:  No edema.  Skin:  No rash present.   ED Course  Procedures  None    Labs Reviewed  POCT RAPID STREP A (OFFICE) negative     MDM   1. Influenza-like illness    Begin Tamiflu.  Prescription written for Benzonatate [facility]) to take at bedtime for night-time cough.  Take plain guaifenesin (1200mg  extended release tabs such as Mucinex)  twice daily, with plenty of water, for cough and congestion.  May add Pseudoephedrine (30mg , one or two every 4 to 6 hours) for sinus congestion.  Get adequate rest.   May use Afrin nasal spray (or generic oxymetazoline) twice daily for about 5 days and then discontinue.  Also recommend using saline nasal spray several times daily and saline nasal irrigation (AYR is a common brand).   Try warm salt water gargles for sore throat.  Stop all antihistamines for now, and other non-prescription cough/cold preparations. May take Ibuprofen 200mg , 4 tabs every 8 hours with food for body aches, headache, etc.   Follow-up with family doctor if not improving about 4 to 5 days.    [name], MD 08/05/15 2220

## 2015-08-05 MED FILL — GABAPENTIN 300 MG CAPSULE: 300 | 30 days supply | Qty: 90 | Fill #0

## 2015-08-05 MED FILL — GABAPENTIN 400 MG CAPSULE: 400 | 30 days supply | Qty: 90 | Fill #1

## 2015-08-29 MED FILL — traZODone HCL 100 MG TABS: 100 | 30 days supply | Qty: 75 | Fill #2

## 2015-08-29 MED FILL — MIRTAZAPINE 15 MG TABLET: 15 | 30 days supply | Qty: 30 | Fill #2

## 2015-08-29 MED FILL — lamoTRIgine 100 MG TABS: 100 | 30 days supply | Qty: 30 | Fill #5

## 2015-09-04 MED FILL — GABAPENTIN 300 MG CAPSULE: 300 | 30 days supply | Qty: 90 | Fill #1

## 2015-09-04 MED FILL — GABAPENTIN 400 MG CAPSULE: 400 | 30 days supply | Qty: 90 | Fill #2

## 2015-09-19 DIAGNOSIS — F330 Major depressive disorder, recurrent, mild: Secondary | ICD-10-CM | POA: Diagnosis not present

## 2015-09-23 MED FILL — lamoTRIgine 100 MG TABS: 100 | 30 days supply | Qty: 30 | Fill #0

## 2015-09-23 MED FILL — traZODone HCL 100 MG TABS: 100 | 30 days supply | Qty: 75 | Fill #0

## 2015-09-23 MED FILL — MIRTAZAPINE 15 MG TABLET: 15 | 30 days supply | Qty: 30 | Fill #0

## 2015-09-27 MED FILL — GABAPENTIN 400 MG CAPSULE: 400 | 30 days supply | Qty: 90 | Fill #0

## 2015-09-27 MED FILL — GABAPENTIN 300 MG CAPSULE: 300 | 30 days supply | Qty: 90 | Fill #0

## 2015-10-18 DIAGNOSIS — H16042 Marginal corneal ulcer, left eye: Secondary | ICD-10-CM | POA: Diagnosis not present

## 2015-10-18 DIAGNOSIS — H16002 Unspecified corneal ulcer, left eye: Secondary | ICD-10-CM | POA: Diagnosis not present

## 2015-10-18 MED FILL — TOBRAMYCIN 0.3% EYE DROPS: 0.3 | 25 days supply | Qty: 5 | Fill #0

## 2015-10-24 MED FILL — KETOROLAC 0.5% OPHTH SOLN: 0.5 | 30 days supply | Qty: 5 | Fill #0

## 2015-10-28 DIAGNOSIS — H16042 Marginal corneal ulcer, left eye: Secondary | ICD-10-CM | POA: Diagnosis not present

## 2015-10-28 MED FILL — AMOX-CLAV 500-125 MG TABLET: 500-125 | 14 days supply | Qty: 14 | Fill #0

## 2015-10-28 MED FILL — traZODone HCL 100 MG TABS: 100 | 30 days supply | Qty: 75 | Fill #1

## 2015-10-28 MED FILL — LOTEMAX 0.5% GEL: 0.5 | 20 days supply | Qty: 5 | Fill #0

## 2015-10-28 MED FILL — ERYTHROMYCIN EYE OINTMENT: 5 | 20 days supply | Qty: 4 | Fill #0

## 2015-10-28 MED FILL — TOBRAMYCIN 0.3% EYE DROPS: 0.3 | 25 days supply | Qty: 5 | Fill #0

## 2015-10-28 MED FILL — lamoTRIgine 100 MG TABS: 100 | 30 days supply | Qty: 30 | Fill #1

## 2015-10-28 MED FILL — BESIVANCE 0.6% SUSP: 0.6 | 20 days supply | Qty: 5 | Fill #0

## 2015-10-28 MED FILL — MIRTAZAPINE 15 MG TABLET: 15 | 30 days supply | Qty: 30 | Fill #1

## 2015-11-01 DIAGNOSIS — H16042 Marginal corneal ulcer, left eye: Secondary | ICD-10-CM | POA: Diagnosis not present

## 2015-11-04 MED FILL — GABAPENTIN 300 MG CAPSULE: 300 | 30 days supply | Qty: 90 | Fill #1

## 2015-11-04 MED FILL — GABAPENTIN 400 MG CAPSULE: 400 | 30 days supply | Qty: 90 | Fill #1

## 2015-11-15 MED FILL — RESTASIS MULTIDOSE 0.05% EY: 0.05 | 90 days supply | Qty: 17 | Fill #0

## 2015-11-22 MED FILL — MIRTAZAPINE 15 MG TABLET: 15 | 30 days supply | Qty: 30 | Fill #2

## 2015-11-22 MED FILL — traZODone HCL 100 MG TABS: 100 | 30 days supply | Qty: 75 | Fill #2

## 2015-11-22 MED FILL — lamoTRIgine 100 MG TABS: 100 | 30 days supply | Qty: 30 | Fill #2

## 2015-12-05 MED FILL — GABAPENTIN 300 MG CAPSULE: 300 | 30 days supply | Qty: 90 | Fill #2

## 2015-12-05 MED FILL — GABAPENTIN 400 MG CAPSULE: 400 | 30 days supply | Qty: 90 | Fill #2

## 2015-12-24 DIAGNOSIS — F330 Major depressive disorder, recurrent, mild: Secondary | ICD-10-CM | POA: Diagnosis not present

## 2015-12-27 MED FILL — traZODone HCL 100 MG TABS: 100 | 30 days supply | Qty: 75 | Fill #3

## 2015-12-27 MED FILL — lamoTRIgine 100 MG TABS: 100 | 30 days supply | Qty: 30 | Fill #3

## 2015-12-27 MED FILL — MIRTAZAPINE 15 MG TABLET: 15 | 30 days supply | Qty: 30 | Fill #3

## 2016-01-06 MED FILL — GABAPENTIN 400 MG CAPSULE: 400 | 30 days supply | Qty: 90 | Fill #3

## 2016-01-14 MED FILL — RESTASIS 0.05% EYE EMULSION: 0.05 | 30 days supply | Qty: 60 | Fill #0

## 2016-01-28 MED FILL — GABAPENTIN 300 MG CAPSULE: 300 | 30 days supply | Qty: 90 | Fill #3

## 2016-01-28 MED FILL — traZODone HCL 100 MG TABS: 100 | 30 days supply | Qty: 75 | Fill #4

## 2016-01-28 MED FILL — MIRTAZAPINE 15 MG TABLET: 15 | 30 days supply | Qty: 30 | Fill #4

## 2016-01-28 MED FILL — lamoTRIgine 100 MG TABS: 100 | 30 days supply | Qty: 30 | Fill #4

## 2016-02-03 MED FILL — GABAPENTIN 400 MG CAPSULE: 400 | 30 days supply | Qty: 90 | Fill #4

## 2016-02-27 MED FILL — lamoTRIgine 100 MG TABS: 100 | 30 days supply | Qty: 30 | Fill #5

## 2016-02-27 MED FILL — GABAPENTIN 300 MG CAPSULE: 300 | 30 days supply | Qty: 90 | Fill #4

## 2016-02-27 MED FILL — MIRTAZAPINE 15 MG TABLET: 15 | 30 days supply | Qty: 30 | Fill #5

## 2016-02-27 MED FILL — traZODone HCL 100 MG TABS: 100 | 30 days supply | Qty: 75 | Fill #5

## 2016-03-04 MED FILL — GABAPENTIN 400 MG CAPSULE: 400 | 30 days supply | Qty: 90 | Fill #5

## 2016-03-09 MED FILL — BESIVANCE 0.6% SUSP: 0.6 | 50 days supply | Qty: 5 | Fill #0

## 2016-03-19 DIAGNOSIS — F330 Major depressive disorder, recurrent, mild: Secondary | ICD-10-CM | POA: Diagnosis not present

## 2016-03-19 DIAGNOSIS — F419 Anxiety disorder, unspecified: Secondary | ICD-10-CM | POA: Diagnosis not present

## 2016-03-19 MED FILL — GABAPENTIN 300 MG CAPSULE: 300 | 30 days supply | Qty: 90 | Fill #0

## 2016-03-19 MED FILL — lamoTRIgine 100 MG TABS: 100 | 30 days supply | Qty: 30 | Fill #0

## 2016-03-19 MED FILL — traZODone HCL 100 MG TABS: 100 | 30 days supply | Qty: 75 | Fill #0

## 2016-03-19 MED FILL — MIRTAZAPINE 15 MG TABLET: 15 | 30 days supply | Qty: 30 | Fill #0

## 2016-03-19 MED FILL — GABAPENTIN 400 MG CAPSULE: 400 | 30 days supply | Qty: 90 | Fill #0

## 2016-04-16 MED FILL — BESIVANCE 0.6% SUSP: 0.6 | 50 days supply | Qty: 5 | Fill #0

## 2016-04-29 MED FILL — traZODone HCL 100 MG TABS: 100 | 30 days supply | Qty: 75 | Fill #1

## 2016-04-29 MED FILL — MIRTAZAPINE 15 MG TABLET: 15 | 30 days supply | Qty: 30 | Fill #1

## 2016-04-29 MED FILL — GABAPENTIN 300 MG CAPSULE: 300 | 30 days supply | Qty: 90 | Fill #1

## 2016-04-29 MED FILL — lamoTRIgine 100 MG TABS: 100 | 30 days supply | Qty: 30 | Fill #1

## 2016-05-06 MED FILL — GABAPENTIN 400 MG CAPSULE: 400 | 30 days supply | Qty: 90 | Fill #1

## 2016-05-13 MED FILL — PRAMIPEXOLE 0.5 MG TABLET: 0.5 | 30 days supply | Qty: 60 | Fill #0

## 2016-05-29 MED FILL — MIRTAZAPINE 15 MG TABLET: 15 | 30 days supply | Qty: 30 | Fill #2

## 2016-05-29 MED FILL — lamoTRIgine 100 MG TABS: 100 | 30 days supply | Qty: 30 | Fill #2

## 2016-05-29 MED FILL — traZODone HCL 100 MG TABS: 100 | 30 days supply | Qty: 75 | Fill #2

## 2016-06-02 MED FILL — GABAPENTIN 300 MG CAPSULE: 300 | 30 days supply | Qty: 90 | Fill #2

## 2016-06-02 MED FILL — GABAPENTIN 400 MG CAPSULE: 400 | 30 days supply | Qty: 90 | Fill #2

## 2016-06-22 MED FILL — BESIVANCE 0.6% SUSP: 0.6 | 50 days supply | Qty: 5 | Fill #1

## 2016-06-26 DIAGNOSIS — F419 Anxiety disorder, unspecified: Secondary | ICD-10-CM | POA: Diagnosis not present

## 2016-06-26 DIAGNOSIS — F330 Major depressive disorder, recurrent, mild: Secondary | ICD-10-CM | POA: Diagnosis not present

## 2016-06-26 DIAGNOSIS — G2581 Restless legs syndrome: Secondary | ICD-10-CM | POA: Diagnosis not present

## 2016-06-26 MED FILL — lamoTRIgine 100 MG TABS: 100 | 30 days supply | Qty: 30 | Fill #0

## 2016-06-26 MED FILL — GABAPENTIN 300 MG CAPSULE: 300 | 30 days supply | Qty: 90 | Fill #0

## 2016-06-26 MED FILL — GABAPENTIN 400 MG CAPSULE: 400 | 30 days supply | Qty: 90 | Fill #0

## 2016-06-26 MED FILL — traZODone HCL 100 MG TABS: 100 | 30 days supply | Qty: 75 | Fill #0

## 2016-06-26 MED FILL — MIRTAZAPINE 15 MG TABLET: 15 | 30 days supply | Qty: 30 | Fill #0

## 2016-06-26 MED FILL — PRAMIPEXOLE 0.5 MG TABLET: 0.5 | 30 days supply | Qty: 60 | Fill #0

## 2016-07-14 DIAGNOSIS — L719 Rosacea, unspecified: Secondary | ICD-10-CM | POA: Diagnosis not present

## 2016-07-17 MED FILL — DOXYCYCLINE MONO 100 MG CAP: 100 | 90 days supply | Qty: 90 | Fill #0

## 2016-07-31 MED FILL — lamoTRIgine 100 MG TABS: 100 | 30 days supply | Qty: 30 | Fill #1

## 2016-07-31 MED FILL — traZODone HCL 100 MG TABS: 100 | 30 days supply | Qty: 75 | Fill #1

## 2016-07-31 MED FILL — MIRTAZAPINE 15 MG TABLET: 15 | 30 days supply | Qty: 30 | Fill #1

## 2016-08-04 MED FILL — BESIVANCE 0.6% SUSP: 0.6 | 50 days supply | Qty: 5 | Fill #2

## 2016-08-07 MED FILL — GABAPENTIN 300 MG CAPSULE: 300 | 30 days supply | Qty: 90 | Fill #1

## 2016-08-07 MED FILL — GABAPENTIN 400 MG CAPSULE: 400 | 30 days supply | Qty: 90 | Fill #1

## 2016-09-02 MED FILL — MIRTAZAPINE 15 MG TABLET: 15 | 30 days supply | Qty: 30 | Fill #2

## 2016-09-02 MED FILL — traZODone HCL 100 MG TABS: 100 | 30 days supply | Qty: 75 | Fill #2

## 2016-09-02 MED FILL — lamoTRIgine 100 MG TABS: 100 | 30 days supply | Qty: 30 | Fill #2

## 2016-09-10 MED FILL — PRAMIPEXOLE 0.5 MG TABLET: 0.5 | 30 days supply | Qty: 60 | Fill #1

## 2016-09-10 MED FILL — GABAPENTIN 400 MG CAPSULE: 400 | 30 days supply | Qty: 90 | Fill #2

## 2016-09-10 MED FILL — GABAPENTIN 300 MG CAPSULE: 300 | 30 days supply | Qty: 90 | Fill #2

## 2016-09-14 DIAGNOSIS — F330 Major depressive disorder, recurrent, mild: Secondary | ICD-10-CM | POA: Diagnosis not present

## 2016-09-14 DIAGNOSIS — F4011 Social phobia, generalized: Secondary | ICD-10-CM | POA: Diagnosis not present

## 2016-09-14 DIAGNOSIS — G2581 Restless legs syndrome: Secondary | ICD-10-CM | POA: Diagnosis not present

## 2016-10-02 MED FILL — MIRTAZAPINE 15 MG TABLET: 15 | 30 days supply | Qty: 30 | Fill #3

## 2016-10-02 MED FILL — lamoTRIgine 100 MG TABS: 100 | 30 days supply | Qty: 30 | Fill #3

## 2016-10-06 MED FILL — traZODone HCL 100 MG TABS: 100 | 30 days supply | Qty: 75 | Fill #3

## 2016-10-06 MED FILL — GABAPENTIN 400 MG CAPSULE: 400 | 30 days supply | Qty: 90 | Fill #3

## 2016-10-09 MED FILL — GABAPENTIN 300 MG CAPSULE: 300 | 30 days supply | Qty: 90 | Fill #3

## 2016-10-23 MED FILL — DOXYCYCLINE HYCLATE 50 MG C: 50 | 31 days supply | Qty: 31 | Fill #0

## 2016-11-04 ENCOUNTER — Emergency Department (INDEPENDENT_AMBULATORY_CARE_PROVIDER_SITE_OTHER)
Admission: EM | Admit: 2016-11-04 | Discharge: 2016-11-04 | Disposition: A | Discharge status: Home / Self Care | Payer: Managed Care, Other (non HMO) | Source: Home / Self Care | Attending: Family Medicine | Admitting: Family Medicine

## 2016-11-04 ENCOUNTER — Emergency Department (INDEPENDENT_AMBULATORY_CARE_PROVIDER_SITE_OTHER): Payer: Managed Care, Other (non HMO)

## 2016-11-04 DIAGNOSIS — M25551 Pain in right hip: Secondary | ICD-10-CM

## 2016-11-04 DIAGNOSIS — M545 Low back pain: Secondary | ICD-10-CM

## 2016-11-04 DIAGNOSIS — M25561 Pain in right knee: Secondary | ICD-10-CM

## 2016-11-04 DIAGNOSIS — M25851 Other specified joint disorders, right hip: Secondary | ICD-10-CM

## 2016-11-04 DIAGNOSIS — M25461 Effusion, right knee: Secondary | ICD-10-CM

## 2016-11-04 DIAGNOSIS — M1711 Unilateral primary osteoarthritis, right knee: Secondary | ICD-10-CM

## 2016-11-04 MED ORDER — HYDROCODONE-ACETAMINOPHEN 2.5-325 MG PO TABS: ORAL_TABLET | ORAL | 0 refills | Status: DC

## 2016-11-04 MED FILL — HYDROCODON-APAP 2.5-325: 2.5-325 | 2 days supply | Qty: 16 | Fill #0

## 2016-11-04 NOTE — Discharge Instructions (Signed)
Norco/Vicodin (hydrocodone-acetaminophen) is a narcotic pain medication, do not combine these medications with others containing tylenol. While taking, do not drink alcohol, drive, or perform any other activities that requires focus while taking these medications.   You may continue to take over the counter ibuprofen or naproxen such as Mortin or Aleve to help with pain and inflammation.

## 2016-11-04 NOTE — ED Triage Notes (Signed)
Pt was working out Monday afternoon, and felt a pop in the right knee.  Has been having pain from the right hip to the knee since.  Has been taking 2 aleve q12 hrs, with tylenol in between.  Also has been using ice.

## 2016-11-04 NOTE — ED Provider Notes (Signed)
CSN: [id number]     Arrival date & time 11/04/16  1007 History   First MD Initiated Contact with Patient 11/04/16 1027     Chief Complaint  Patient presents with  . Knee Pain    right   (Consider location/radiation/quality/duration/timing/severity/associated sxs/prior Treatment) HPI  David Solis is a 66 y.o. male presenting to UC with c/o Right hip and Right knee pain that started 2 days ago after working out at [employer].  Pt notes he felt a "pop" in his knee. Pain is worse on the medial aspect of knee but radiates across front of knee. He is also having pain in Right lower back and hip radiating down his Right thigh. Pain is worse with weight bearing and certain positions or movements.  Pain is aching and sore, 9/10.  Hx of knee pain in the past but no surgeries.  He has been resting his knee, using ice, ibuprofen and aleve w/o relief.    Past Medical History:  Diagnosis Date  . Asthma   . Depression   . Duodenal ulcer disease   . GERD (gastroesophageal reflux disease)   . Neuropathy    in legs  . Restless leg    Past Surgical History:  Procedure Laterality Date  . BACK SURGERY     cervical fusion   . CERVICAL FUSION    . CHOLECYSTECTOMY    . ESOPHAGOGASTRODUODENOSCOPY  05/10/2012   Procedure: ESOPHAGOGASTRODUODENOSCOPY (EGD);  Surgeon: Beryle Beams, MD;  Location: [facility];  Service: Endoscopy;  Laterality: N/A;   Family History  Problem Relation Age of Onset  . Cancer Mother        breast   Social History  Substance Use Topics  . Smoking status: Former Smoker    Quit date: 05/09/1994  . Smokeless tobacco: Never Used  . Alcohol use No    Review of Systems  Musculoskeletal: Positive for arthralgias, back pain, gait problem, joint swelling and myalgias. Negative for neck pain.  Skin: Negative for color change and wound.  Neurological: Negative for weakness and numbness.    Allergies  Patient has no known allergies.  Home Medications   Prior to Admission  medications   Medication Sig Start Date End Date Taking? Authorizing Provider  benzonatate (TESSALON) 200 MG capsule Take 1 capsule (200 mg total) by mouth at bedtime. Take as needed for cough 08/03/15   [name], MD  gabapentin (NEURONTIN) 300 MG capsule Take 300 mg by mouth 3 (three) times daily.     [provider]  Hydrocodone-Acetaminophen 2.5-325 MG TABS Take 1-2 tabs every 4-6 hours as needed for pain 11/04/16   Noland Fordyce, PA-C  LamoTRIgine (LAMICTAL PO) Take by mouth.    [provider]  Mirtazapine (REMERON PO) Take by mouth.    [provider]  oseltamivir (TAMIFLU) 75 MG capsule Take 1 capsule (75 mg total) by mouth every 12 (twelve) hours. 08/03/15   [name], MD  TRAZODONE HCL PO Take by mouth.    [provider]   Meds Ordered and Administered this Visit  Medications - No data to display  BP (!) 144/87 (BP Location: Left Arm)   Pulse 68   Temp 97.5 F (36.4 C) (Oral)   Ht 5\' 10"  (1.778 m)   Wt 242 lb (109.8 kg)   SpO2 95%   BMI 34.72 kg/m  No data found.   Physical Exam  Constitutional: He is oriented to person, place, and time. He appears well-developed and  well-nourished. No distress.  HENT:  Head: Normocephalic and atraumatic.  Eyes: EOM are normal.  Neck: Normal range of motion.  Cardiovascular: Normal rate.   Pulmonary/Chest: Effort normal.  Musculoskeletal: Normal range of motion. He exhibits tenderness. He exhibits no edema.  Right knee: tenderness to anterior aspect, worse along medial aspect of knee but not in the knee joint.  No tenderness to posterior knee. Full ROM Calf is soft, non-tender.  Right lower back and hip: mildly tender. No crepitus. Full ROM. No midline spinal tenderness.   Neurological: He is alert and oriented to person, place, and time.  Skin: Skin is warm and dry. No rash noted. He is not diaphoretic. No erythema.  Psychiatric: He has a normal mood and affect. His behavior is  normal.  Nursing note and vitals reviewed.   Urgent Care Course     Procedures (including critical care time)  Labs Review Labs Reviewed - No data to display  Imaging Review Dg Knee Complete 4 Views Right  Result Date: 11/04/2016 CLINICAL DATA:  Low back, right hip and knee pain for 2 days after exercising. EXAM: RIGHT KNEE - COMPLETE 4+ VIEW COMPARISON:  Radiographs 01/14/2015. FINDINGS: The mineralization and alignment are normal. There is no evidence of acute fracture or dislocation. There are mild tricompartmental degenerative changes, greatest within the lateral compartment. There is a small knee joint effusion. IMPRESSION: Mild tricompartmental degenerative changes and small joint effusion. No acute osseous findings. Electronically Signed   By: Richardean Sale M.D.   On: 11/04/2016 10:56   Dg Hip Unilat W Or Wo Pelvis 2-3 Views Right  Result Date: 11/04/2016 CLINICAL DATA:  Low back, right hip and knee pain for 2 days after exercising. EXAM: DG HIP (WITH OR WITHOUT PELVIS) 2-3V RIGHT COMPARISON:  None. FINDINGS: The mineralization and alignment are normal. There is no evidence of acute fracture or dislocation. No evidence of femoral head avascular necrosis. There are mild degenerative changes in the lower lumbar spine and at both hips. Superior acetabular overgrowth is present bilaterally, potentially contributing to pincer type femoroacetabular impingement. IMPRESSION: No acute osseous findings. Mild degenerative changes at both hips with potential pincer type femoroacetabular impingement. Electronically Signed   By: Richardean Sale M.D.   On: 11/04/2016 10:55      MDM   1. Acute pain of right knee   2. Right hip pain   3. Tricompartment osteoarthritis of right knee   4. Effusion of right knee   5. Femoroacetabular impingement of right hip    Pain to Right hip and knee after going to the gym. Increased pain with weight bearing.  Plain films: as noted above. Knee brace  applied for comfort. Pt declined crutches. Rx: Norco for severe pain  Home care instructions provided. F/u with Sports Medicine or Orthopedist in 1-2 weeks if not improving.     Noland Fordyce, PA-C 11/04/16 1137

## 2016-11-05 MED FILL — MIRTAZAPINE 15 MG TABLET: 15 | 30 days supply | Qty: 30 | Fill #4

## 2016-11-05 MED FILL — lamoTRIgine 100 MG TABS: 100 | 30 days supply | Qty: 30 | Fill #4

## 2016-11-05 MED FILL — GABAPENTIN 300 MG CAPSULE: 300 | 30 days supply | Qty: 90 | Fill #4

## 2016-11-05 MED FILL — PRAMIPEXOLE 0.5 MG TABLET: 0.5 | 30 days supply | Qty: 60 | Fill #2

## 2016-11-10 MED FILL — GABAPENTIN 400 MG CAPSULE: 400 | 30 days supply | Qty: 90 | Fill #4

## 2016-11-10 MED FILL — traZODone HCL 100 MG TABS: 100 | 30 days supply | Qty: 75 | Fill #4

## 2016-11-13 DIAGNOSIS — M170 Bilateral primary osteoarthritis of knee: Secondary | ICD-10-CM | POA: Diagnosis not present

## 2016-11-13 DIAGNOSIS — M25561 Pain in right knee: Secondary | ICD-10-CM | POA: Diagnosis not present

## 2016-11-23 DIAGNOSIS — M25561 Pain in right knee: Secondary | ICD-10-CM | POA: Diagnosis not present

## 2016-11-23 DIAGNOSIS — M5431 Sciatica, right side: Secondary | ICD-10-CM | POA: Diagnosis not present

## 2016-11-23 DIAGNOSIS — M170 Bilateral primary osteoarthritis of knee: Secondary | ICD-10-CM | POA: Diagnosis not present

## 2016-11-23 DIAGNOSIS — M5126 Other intervertebral disc displacement, lumbar region: Secondary | ICD-10-CM | POA: Diagnosis not present

## 2016-11-23 MED FILL — predniSONE 5 MG TABS: 5 | 6 days supply | Qty: 21 | Fill #0

## 2016-12-03 MED FILL — lamoTRIgine 100 MG TABS: 100 | 30 days supply | Qty: 30 | Fill #5

## 2016-12-07 MED FILL — traZODone HCL 100 MG TABS: 100 | 30 days supply | Qty: 75 | Fill #5

## 2016-12-07 MED FILL — PRAMIPEXOLE 0.5 MG TABLET: 0.5 | 30 days supply | Qty: 60 | Fill #3

## 2016-12-07 MED FILL — MIRTAZAPINE 15 MG TABLET: 15 | 30 days supply | Qty: 30 | Fill #5

## 2016-12-10 MED FILL — GABAPENTIN 300 MG CAPSULE: 300 | 30 days supply | Qty: 90 | Fill #5

## 2016-12-10 MED FILL — GABAPENTIN 400 MG CAPSULE: 400 | 30 days supply | Qty: 90 | Fill #5

## 2016-12-14 DIAGNOSIS — M5431 Sciatica, right side: Secondary | ICD-10-CM | POA: Diagnosis not present

## 2016-12-14 DIAGNOSIS — M5126 Other intervertebral disc displacement, lumbar region: Secondary | ICD-10-CM | POA: Diagnosis not present

## 2016-12-14 DIAGNOSIS — M48062 Spinal stenosis, lumbar region with neurogenic claudication: Secondary | ICD-10-CM | POA: Diagnosis not present

## 2016-12-14 DIAGNOSIS — M170 Bilateral primary osteoarthritis of knee: Secondary | ICD-10-CM | POA: Diagnosis not present

## 2017-01-08 MED FILL — PRAMIPEXOLE 0.5 MG TABLET: 0.5 | 30 days supply | Qty: 60 | Fill #4

## 2017-01-08 MED FILL — MIRTAZAPINE 15 MG TABLET: 15 | 30 days supply | Qty: 30 | Fill #3

## 2017-01-08 MED FILL — lamoTRIgine 100 MG TABS: 100 | 30 days supply | Qty: 30 | Fill #3

## 2017-01-11 MED FILL — GABAPENTIN 400 MG CAPSULE: 400 | 30 days supply | Qty: 90 | Fill #3

## 2017-01-11 MED FILL — GABAPENTIN 300 MG CAPSULE: 300 | 30 days supply | Qty: 90 | Fill #3

## 2017-01-13 DIAGNOSIS — H524 Presbyopia: Secondary | ICD-10-CM | POA: Diagnosis not present

## 2017-01-13 DIAGNOSIS — H16042 Marginal corneal ulcer, left eye: Secondary | ICD-10-CM | POA: Diagnosis not present

## 2017-01-13 DIAGNOSIS — H2513 Age-related nuclear cataract, bilateral: Secondary | ICD-10-CM | POA: Diagnosis not present

## 2017-01-13 DIAGNOSIS — H52223 Regular astigmatism, bilateral: Secondary | ICD-10-CM | POA: Diagnosis not present

## 2017-01-13 DIAGNOSIS — H1840 Unspecified corneal degeneration: Secondary | ICD-10-CM | POA: Diagnosis not present

## 2017-01-13 DIAGNOSIS — H18462 Peripheral corneal degeneration, left eye: Secondary | ICD-10-CM | POA: Diagnosis not present

## 2017-01-13 DIAGNOSIS — H04123 Dry eye syndrome of bilateral lacrimal glands: Secondary | ICD-10-CM | POA: Diagnosis not present

## 2017-01-13 DIAGNOSIS — H5203 Hypermetropia, bilateral: Secondary | ICD-10-CM | POA: Diagnosis not present

## 2017-01-13 DIAGNOSIS — H11002 Unspecified pterygium of left eye: Secondary | ICD-10-CM | POA: Diagnosis not present

## 2017-01-13 MED FILL — DOXYCYCLINE HYCLATE 50 MG C: 50 | 31 days supply | Qty: 31 | Fill #0

## 2017-01-13 MED FILL — BESIVANCE 0.6% SUSP: 0.6 | 7 days supply | Qty: 5 | Fill #0

## 2017-01-14 DIAGNOSIS — M48062 Spinal stenosis, lumbar region with neurogenic claudication: Secondary | ICD-10-CM | POA: Diagnosis not present

## 2017-01-14 DIAGNOSIS — M5431 Sciatica, right side: Secondary | ICD-10-CM | POA: Diagnosis not present

## 2017-01-14 MED FILL — traZODone HCL 100 MG TABS: 100 | 30 days supply | Qty: 75 | Fill #3

## 2017-01-21 DIAGNOSIS — M48062 Spinal stenosis, lumbar region with neurogenic claudication: Secondary | ICD-10-CM | POA: Diagnosis not present

## 2017-02-01 DIAGNOSIS — M5431 Sciatica, right side: Secondary | ICD-10-CM | POA: Diagnosis not present

## 2017-02-01 DIAGNOSIS — M48062 Spinal stenosis, lumbar region with neurogenic claudication: Secondary | ICD-10-CM | POA: Diagnosis not present

## 2017-02-01 DIAGNOSIS — M5126 Other intervertebral disc displacement, lumbar region: Secondary | ICD-10-CM | POA: Diagnosis not present

## 2017-02-05 MED FILL — GABAPENTIN 400 MG CAPSULE: 400 | 30 days supply | Qty: 90 | Fill #0

## 2017-02-05 MED FILL — lamoTRIgine 100 MG TABS: 100 | 30 days supply | Qty: 30 | Fill #0

## 2017-02-05 MED FILL — PRAMIPEXOLE 0.5 MG TABLET: 0.5 | 30 days supply | Qty: 60 | Fill #0

## 2017-02-05 MED FILL — GABAPENTIN 300 MG CAPSULE: 300 | 30 days supply | Qty: 90 | Fill #0

## 2017-02-05 MED FILL — MIRTAZAPINE 15 MG TABLET: 15 | 30 days supply | Qty: 30 | Fill #0

## 2017-02-08 DIAGNOSIS — H5203 Hypermetropia, bilateral: Secondary | ICD-10-CM | POA: Diagnosis not present

## 2017-02-08 DIAGNOSIS — H1032 Unspecified acute conjunctivitis, left eye: Secondary | ICD-10-CM | POA: Diagnosis not present

## 2017-02-08 DIAGNOSIS — H52223 Regular astigmatism, bilateral: Secondary | ICD-10-CM | POA: Diagnosis not present

## 2017-02-08 DIAGNOSIS — H04123 Dry eye syndrome of bilateral lacrimal glands: Secondary | ICD-10-CM | POA: Diagnosis not present

## 2017-02-08 DIAGNOSIS — H16042 Marginal corneal ulcer, left eye: Secondary | ICD-10-CM | POA: Diagnosis not present

## 2017-02-08 DIAGNOSIS — H2513 Age-related nuclear cataract, bilateral: Secondary | ICD-10-CM | POA: Diagnosis not present

## 2017-02-08 DIAGNOSIS — H1840 Unspecified corneal degeneration: Secondary | ICD-10-CM | POA: Diagnosis not present

## 2017-02-08 DIAGNOSIS — H11002 Unspecified pterygium of left eye: Secondary | ICD-10-CM | POA: Diagnosis not present

## 2017-02-08 DIAGNOSIS — H18462 Peripheral corneal degeneration, left eye: Secondary | ICD-10-CM | POA: Diagnosis not present

## 2017-02-08 MED FILL — LOTEMAX 0.5% EYE DROPS: 0.5 | 50 days supply | Qty: 5 | Fill #0

## 2017-02-09 MED FILL — traZODone HCL 100 MG TABS: 100 | 30 days supply | Qty: 75 | Fill #0

## 2017-02-12 DIAGNOSIS — H04123 Dry eye syndrome of bilateral lacrimal glands: Secondary | ICD-10-CM | POA: Diagnosis not present

## 2017-02-12 DIAGNOSIS — H2513 Age-related nuclear cataract, bilateral: Secondary | ICD-10-CM | POA: Diagnosis not present

## 2017-02-12 DIAGNOSIS — H18462 Peripheral corneal degeneration, left eye: Secondary | ICD-10-CM | POA: Diagnosis not present

## 2017-02-12 DIAGNOSIS — H52223 Regular astigmatism, bilateral: Secondary | ICD-10-CM | POA: Diagnosis not present

## 2017-02-12 DIAGNOSIS — H1032 Unspecified acute conjunctivitis, left eye: Secondary | ICD-10-CM | POA: Diagnosis not present

## 2017-02-12 DIAGNOSIS — H16042 Marginal corneal ulcer, left eye: Secondary | ICD-10-CM | POA: Diagnosis not present

## 2017-02-12 DIAGNOSIS — H11002 Unspecified pterygium of left eye: Secondary | ICD-10-CM | POA: Diagnosis not present

## 2017-02-12 DIAGNOSIS — H5203 Hypermetropia, bilateral: Secondary | ICD-10-CM | POA: Diagnosis not present

## 2017-02-12 DIAGNOSIS — H1840 Unspecified corneal degeneration: Secondary | ICD-10-CM | POA: Diagnosis not present

## 2017-03-03 MED FILL — DOXYCYCLINE HYC 50 MG CAP: 50 | 31 days supply | Qty: 31 | Fill #1

## 2017-03-10 ENCOUNTER — Ambulatory Visit (HOSPITAL_COMMUNITY)
Admission: RE | Admit: 2017-03-10 | Discharge: 2017-03-10 | Disposition: A | Discharge status: Home / Self Care | Payer: Managed Care, Other (non HMO) | Attending: Psychiatry | Admitting: Psychiatry

## 2017-03-10 ENCOUNTER — Encounter (HOSPITAL_COMMUNITY): Payer: Self-pay | Admitting: Registered Nurse

## 2017-03-10 DIAGNOSIS — Z125 Encounter for screening for malignant neoplasm of prostate: Secondary | ICD-10-CM | POA: Diagnosis not present

## 2017-03-10 DIAGNOSIS — Z13220 Encounter for screening for lipoid disorders: Secondary | ICD-10-CM | POA: Diagnosis not present

## 2017-03-10 DIAGNOSIS — R5383 Other fatigue: Secondary | ICD-10-CM | POA: Diagnosis not present

## 2017-03-10 DIAGNOSIS — F329 Major depressive disorder, single episode, unspecified: Secondary | ICD-10-CM | POA: Diagnosis not present

## 2017-03-10 DIAGNOSIS — Z23 Encounter for immunization: Secondary | ICD-10-CM | POA: Diagnosis not present

## 2017-03-10 DIAGNOSIS — F339 Major depressive disorder, recurrent, unspecified: Secondary | ICD-10-CM | POA: Diagnosis not present

## 2017-03-10 DIAGNOSIS — N3944 Nocturnal enuresis: Secondary | ICD-10-CM | POA: Diagnosis not present

## 2017-03-10 DIAGNOSIS — R42 Dizziness and giddiness: Secondary | ICD-10-CM | POA: Diagnosis not present

## 2017-03-10 NOTE — H&P (Signed)
Behavioral Health Medical Screening Exam  David Solis is an 66 y.o. male.  Total Time spent with patient: 45 minutes  Psychiatric Specialty Exam: Physical Exam  Vitals reviewed. Constitutional: He is oriented to person, place, and time.  Neck: Normal range of motion.  Cardiovascular: Normal rate and regular rhythm.   Respiratory: Effort normal.  Musculoskeletal: Normal range of motion.  Neurological: He is alert and oriented to person, place, and time.  Skin: Skin is warm and dry.  Psychiatric: His speech is normal and behavior is normal. Judgment and thought content normal. Cognition and memory are normal. He exhibits a depressed mood.    Review of Systems  Psychiatric/Behavioral: Positive for depression (Stable). Hallucinations: denies. Memory loss: denies. Substance abuse: denies. Suicidal ideas: denies. Nervous/anxious: denies. Insomnia: denies.   All other systems reviewed and are negative.   There were no vitals taken for this visit.There is no height or weight on file to calculate BMI.  General Appearance: Casual and Neat  Eye Contact:  Good  Speech:  Clear and Coherent and Normal Rate  Volume:  Normal  Mood:  Depressed  Affect:  Congruent and Depressed  Thought Process:  Coherent and Goal Directed  Orientation:  Full (Time, Place, and Person)  Thought Content:  Logical  Suicidal Thoughts:  No  Homicidal Thoughts:  No  Memory:  Immediate;   Good Recent;   Good Remote;   Good  Judgement:  Intact  Insight:  Good and Present  Psychomotor Activity:  Normal  Concentration: Concentration: Good and Attention Span: Good  Recall:  Good  Fund of Knowledge:Good  Language: Good  Akathisia:  No  Handed:  Right  AIMS (if indicated):     Assets:  Communication Skills Desire for Improvement Housing Intimacy Physical Health Social Support Transportation  Sleep:      Assessment:  Patient seen by Dr. Dwyane Dee and this provider.  David Solis reports that he has a history  of depression worsening during fall season, past history of suicide attempt around the same time.  Patient see primary provider and was sent to [facility] for evaluation related to history.  Patient states that he does have suicidal thoughts on and off but none at current time.  Unable to state what stressor is other than around this time of year he gets worsening depression.  States that it has been 5 year since last suicide attempt and that he has been doing good.  Wife at side states that patient has been dealing with depression and that she feels that he is safe.  Patient denies suicidal/homicidal thoughts, psychosis, and paranoia. He has outpatient services with Dr. Erling Cruz and next visit scheduled in November 2018.  Reports that he is compliant with his medications and that he feels safe at home and has support system and people he can contact if depression worsens and he has suicidal or self harming thoughts.  Wife in agreement and feels that patient has been doing well at home and that she feels safe with him being home and that she doesn't feel that he is going to do anything.   Encouraged patient that he needed to reschedule appointment sooner with Dr Erling Cruz since this is his worst time of year.  Patient and wife in agreement.     Musculoskeletal: Strength & Muscle Tone: within normal limits Gait & Station: normal Patient leans: N/A  There were no vitals taken for this visit.  Recommendations:  Discharge to follow up with Dr Erling Cruz Based on my  evaluation the patient does not appear to have an emergency medical condition.  David Truman, NP 03/10/2017, 12:26 PM

## 2017-03-12 MED FILL — MIRTAZAPINE 15 MG TAB: 15 | 30 days supply | Qty: 30 | Fill #1

## 2017-03-12 MED FILL — lamoTRIgine 100 MG TABS: 100 | 30 days supply | Qty: 30 | Fill #1

## 2017-03-12 MED FILL — GABAPENTIN 400 MG CAPSULE: 400 | 30 days supply | Qty: 90 | Fill #1

## 2017-03-18 MED FILL — GABAPENTIN 300 MG CAPSULE: 300 | 30 days supply | Qty: 90 | Fill #0

## 2017-03-18 MED FILL — traZODone HCL 100 MG TABS: 100 | 30 days supply | Qty: 75 | Fill #0

## 2017-03-18 MED FILL — PRAMIPEXOLE 0.5 MG TABLET: 0.5 | 30 days supply | Qty: 60 | Fill #0

## 2017-04-09 MED FILL — DOXYCYCLINE HYC 50 MG CAP: 50 | 30 days supply | Qty: 30 | Fill #2

## 2017-04-22 MED FILL — lamoTRIgine 100 MG TABS: 100 | 30 days supply | Qty: 30 | Fill #2

## 2017-04-22 MED FILL — MIRTAZAPINE 15 MG TAB: 15 | 30 days supply | Qty: 30 | Fill #2

## 2017-04-22 MED FILL — GABAPENTIN 400 MG CAPSULE: 400 | 30 days supply | Qty: 90 | Fill #2

## 2017-05-03 MED FILL — GABAPENTIN 300 MG CAPSULE: 300 | 30 days supply | Qty: 90 | Fill #1

## 2017-05-03 MED FILL — traZODone HCL 100 MG TABS: 100 | 30 days supply | Qty: 75 | Fill #1

## 2017-05-03 MED FILL — PRAMIPEXOLE 0.5 MG TABLET: 0.5 | 30 days supply | Qty: 60 | Fill #1

## 2017-05-04 DIAGNOSIS — F332 Major depressive disorder, recurrent severe without psychotic features: Secondary | ICD-10-CM | POA: Diagnosis not present

## 2017-05-04 DIAGNOSIS — R69 Illness, unspecified: Secondary | ICD-10-CM | POA: Diagnosis not present

## 2017-05-12 DIAGNOSIS — H524 Presbyopia: Secondary | ICD-10-CM | POA: Diagnosis not present

## 2017-05-12 DIAGNOSIS — H18462 Peripheral corneal degeneration, left eye: Secondary | ICD-10-CM | POA: Diagnosis not present

## 2017-05-12 DIAGNOSIS — H52223 Regular astigmatism, bilateral: Secondary | ICD-10-CM | POA: Diagnosis not present

## 2017-05-12 DIAGNOSIS — H16042 Marginal corneal ulcer, left eye: Secondary | ICD-10-CM | POA: Diagnosis not present

## 2017-05-12 DIAGNOSIS — H04123 Dry eye syndrome of bilateral lacrimal glands: Secondary | ICD-10-CM | POA: Diagnosis not present

## 2017-05-12 DIAGNOSIS — H1840 Unspecified corneal degeneration: Secondary | ICD-10-CM | POA: Diagnosis not present

## 2017-05-12 DIAGNOSIS — H43813 Vitreous degeneration, bilateral: Secondary | ICD-10-CM | POA: Diagnosis not present

## 2017-05-12 DIAGNOSIS — H2513 Age-related nuclear cataract, bilateral: Secondary | ICD-10-CM | POA: Diagnosis not present

## 2017-05-12 DIAGNOSIS — H5203 Hypermetropia, bilateral: Secondary | ICD-10-CM | POA: Diagnosis not present

## 2017-05-12 DIAGNOSIS — H11002 Unspecified pterygium of left eye: Secondary | ICD-10-CM | POA: Diagnosis not present

## 2017-05-26 MED FILL — lamoTRIgine 100 MG TABS: 100 | 30 days supply | Qty: 30 | Fill #3

## 2017-05-26 MED FILL — GABAPENTIN 400 MG CAPSULE: 400 | 30 days supply | Qty: 90 | Fill #3

## 2017-05-26 MED FILL — MIRTAZAPINE 15 MG TAB: 15 | 30 days supply | Qty: 30 | Fill #3

## 2017-05-26 MED FILL — DOXYCYCLINE HYC 50 MG CAP: 50 | 30 days supply | Qty: 30 | Fill #3

## 2017-06-07 MED FILL — PRAMIPEXOLE 0.5 MG TABLET: 0.5 | 30 days supply | Qty: 60 | Fill #2

## 2017-06-10 MED FILL — traZODone HCL 100 MG TABS: 100 | 30 days supply | Qty: 75 | Fill #2

## 2017-06-10 MED FILL — GABAPENTIN 300 MG CAPSULE: 300 | 30 days supply | Qty: 90 | Fill #2

## 2017-06-23 MED FILL — DOXYCYCLINE HYC 50 MG CAP: 50 | 30 days supply | Qty: 30 | Fill #4

## 2017-06-29 MED FILL — GABAPENTIN 400 MG CAPSULE: 400 | 30 days supply | Qty: 90 | Fill #4

## 2017-06-29 MED FILL — lamoTRIgine 100 MG TABS: 100 | 30 days supply | Qty: 30 | Fill #4

## 2017-06-29 MED FILL — MIRTAZAPINE 15 MG TAB: 15 | 30 days supply | Qty: 30 | Fill #4

## 2017-06-29 MED FILL — LOTEMAX 0.5% GEL: 0.5 | 30 days supply | Qty: 5 | Fill #0

## 2017-07-15 MED FILL — PRAMIPEXOLE 0.5 MG TABLET: 0.5 | 30 days supply | Qty: 60 | Fill #3

## 2017-07-28 MED FILL — DOXYCYCLINE HYC 50 MG CAP: 50 | 30 days supply | Qty: 30 | Fill #5

## 2017-07-28 MED FILL — GABAPENTIN 300 MG CAPSULE: 300 | 30 days supply | Qty: 90 | Fill #3

## 2017-08-06 DIAGNOSIS — G2581 Restless legs syndrome: Secondary | ICD-10-CM | POA: Diagnosis not present

## 2017-08-06 DIAGNOSIS — R69 Illness, unspecified: Secondary | ICD-10-CM | POA: Diagnosis not present

## 2017-08-06 DIAGNOSIS — F451 Undifferentiated somatoform disorder: Secondary | ICD-10-CM | POA: Diagnosis not present

## 2017-08-06 DIAGNOSIS — F332 Major depressive disorder, recurrent severe without psychotic features: Secondary | ICD-10-CM | POA: Diagnosis not present

## 2017-08-06 MED FILL — MIRTAZAPINE 15 MG TABLET: 15 | 30 days supply | Qty: 30 | Fill #0

## 2017-08-06 MED FILL — traZODone HCL 100 MG TABS: 100 | 30 days supply | Qty: 75 | Fill #3

## 2017-08-06 MED FILL — GABAPENTIN 400 MG CAPSULE: 400 | 30 days supply | Qty: 90 | Fill #0

## 2017-08-06 MED FILL — lamoTRIgine 100 MG TABS: 100 | 30 days supply | Qty: 30 | Fill #5

## 2017-08-20 MED FILL — PRAMIPEXOLE 0.5 MG TABLET: 0.5 | 30 days supply | Qty: 60 | Fill #4

## 2017-09-01 MED FILL — DOXYCYCLINE HYCLATE 50 MG C: 50 | 30 days supply | Qty: 30 | Fill #6

## 2017-09-10 MED FILL — MIRTAZAPINE 15 MG TABLET: 15 | 30 days supply | Qty: 30 | Fill #1

## 2017-09-10 MED FILL — lamoTRIgine 100 MG TABS: 100 | 30 days supply | Qty: 30 | Fill #0

## 2017-09-13 MED FILL — GABAPENTIN 300 MG CAPSULE: 300 | 30 days supply | Qty: 90 | Fill #4

## 2017-09-13 MED FILL — GABAPENTIN 400 MG CAPSULE: 400 | 30 days supply | Qty: 90 | Fill #1

## 2017-09-24 MED FILL — traZODone HCL 100 MG TABS: 100 | 30 days supply | Qty: 75 | Fill #4

## 2017-10-04 MED FILL — PRAMIPEXOLE 0.5 MG TABLET: 0.5 | 30 days supply | Qty: 60 | Fill #5

## 2017-10-19 MED FILL — GABAPENTIN 400 MG CAPSULE: 400 | 30 days supply | Qty: 90 | Fill #2

## 2017-10-19 MED FILL — MIRTAZAPINE 15 MG TABLET: 15 | 30 days supply | Qty: 30 | Fill #2

## 2017-10-19 MED FILL — lamoTRIgine 100 MG TABS: 100 | 30 days supply | Qty: 30 | Fill #1

## 2017-10-27 DIAGNOSIS — G2581 Restless legs syndrome: Secondary | ICD-10-CM | POA: Diagnosis not present

## 2017-10-27 DIAGNOSIS — R69 Illness, unspecified: Secondary | ICD-10-CM | POA: Diagnosis not present

## 2017-10-27 DIAGNOSIS — F451 Undifferentiated somatoform disorder: Secondary | ICD-10-CM | POA: Diagnosis not present

## 2017-10-27 DIAGNOSIS — F332 Major depressive disorder, recurrent severe without psychotic features: Secondary | ICD-10-CM | POA: Diagnosis not present

## 2017-10-29 MED FILL — LOTEMAX 0.5% GEL: 0.5 | 30 days supply | Qty: 5 | Fill #1

## 2017-10-29 MED FILL — GABAPENTIN 300 MG CAPSULE: 300 | 30 days supply | Qty: 90 | Fill #5

## 2017-11-09 MED FILL — PRAMIPEXOLE 0.5 MG TABLET: 0.5 | 30 days supply | Qty: 60 | Fill #0

## 2017-11-12 MED FILL — traZODone HCL 100 MG TABS: 100 | 30 days supply | Qty: 75 | Fill #5

## 2017-11-23 MED FILL — lamoTRIgine 100 MG TABS: 100 | 30 days supply | Qty: 30 | Fill #2

## 2017-11-23 MED FILL — MIRTAZAPINE 15 MG TABLET: 15 | 30 days supply | Qty: 30 | Fill #3

## 2017-11-23 MED FILL — GABAPENTIN 400 MG CAPSULE: 400 | 30 days supply | Qty: 90 | Fill #3

## 2017-12-03 ENCOUNTER — Other Ambulatory Visit: Payer: Self-pay

## 2017-12-03 ENCOUNTER — Emergency Department (INDEPENDENT_AMBULATORY_CARE_PROVIDER_SITE_OTHER)
Admission: EM | Admit: 2017-12-03 | Discharge: 2017-12-03 | Disposition: A | Discharge status: Home / Self Care | Payer: Medicare Other | Source: Home / Self Care | Attending: Family Medicine | Admitting: Family Medicine

## 2017-12-03 DIAGNOSIS — Z23 Encounter for immunization: Secondary | ICD-10-CM | POA: Diagnosis not present

## 2017-12-03 DIAGNOSIS — S61209A Unspecified open wound of unspecified finger without damage to nail, initial encounter: Secondary | ICD-10-CM

## 2017-12-03 MED ORDER — HYDROCODONE-ACETAMINOPHEN 5-325 MG PO TABS: ORAL_TABLET | ORAL | 0 refills | Status: DC

## 2017-12-03 MED ORDER — TETANUS-DIPHTH-ACELL PERTUSSIS 5-2.5-18.5 LF-MCG/0.5 IM SUSP
0.5000 mL | Freq: Once | INTRAMUSCULAR | Status: AC
  Administered 2017-12-03: 0.5 mL via INTRAMUSCULAR

## 2017-12-03 MED FILL — HYDROCODON-APAP 5-325: 5-325 | 5 days supply | Qty: 5 | Fill #0

## 2017-12-03 NOTE — ED Triage Notes (Signed)
Pt cut right hand 2nd digit approximately 1 hour ago on a model plane. Unsure of last td shot.

## 2017-12-03 NOTE — ED Provider Notes (Signed)
David Solis [facility]    CSN: [id number] Arrival date & time: 12/03/17  1303     History   Chief Complaint Chief Complaint  Patient presents with  . Laceration    HPI David Solis is a 67 y.o. male.   While working on a model plane about 1 hour ago, patient abraded the distal surface of his right second finger resulting in a superficial skin avulsion.  He does not remember his last Tdap.  The history is provided by the patient.  Laceration  Location:  Finger Finger laceration location:  R index finger Length:  2.5cm Depth:  Through dermis Quality: avulsion   Bleeding: controlled   Time since incident:  1 hour Injury mechanism: sharp wood edge. Pain details:    Quality:  Dull   Severity:  Moderate   Timing:  Constant   Progression:  Unchanged Foreign body present:  No foreign bodies Worsened by:  Movement Ineffective treatments:  None tried Tetanus status:  Out of date Associated symptoms: no numbness and no swelling     Past Medical History:  Diagnosis Date  . Asthma   . Depression   . Duodenal ulcer disease   . GERD (gastroesophageal reflux disease)   . Neuropathy    in legs  . Restless leg     Patient Active Problem List   Diagnosis Date Noted  . Nausea & vomiting 05/10/2012  . Chronic abdominal pain 05/10/2012  . Depression 05/09/2012  . Duodenal ulcer 05/09/2012  . GERD (gastroesophageal reflux disease) 05/09/2012  . Neuropathy of lower extremity 05/09/2012  . Restless leg syndrome 05/09/2012  . Gastric outlet obstruction 05/09/2012    Past Surgical History:  Procedure Laterality Date  . BACK SURGERY     cervical fusion   . CERVICAL FUSION    . CHOLECYSTECTOMY    . ESOPHAGOGASTRODUODENOSCOPY  05/10/2012   Procedure: ESOPHAGOGASTRODUODENOSCOPY (EGD);  Surgeon: Beryle Beams, MD;  Location: [facility];  Service: Endoscopy;  Laterality: N/A;       Home Medications    Prior to Admission medications   Medication Sig Start Date End  Date Taking? Authorizing Provider  gabapentin (NEURONTIN) 300 MG capsule Take 300 mg by mouth 3 (three) times daily.     [provider]  HYDROcodone-acetaminophen (NORCO/VICODIN) 5-325 MG tablet Take one by mouth at bedtime as needed for pain 12/03/17   [name], MD  LamoTRIgine (LAMICTAL PO) Take by mouth.    [provider]  Mirtazapine (REMERON PO) Take by mouth.    [provider]  oseltamivir (TAMIFLU) 75 MG capsule Take 1 capsule (75 mg total) by mouth every 12 (twelve) hours. 08/03/15   [name], MD  TRAZODONE HCL PO Take by mouth.    [provider]    Family History Family History  Problem Relation Age of Onset  . Cancer Mother        breast    Social History Social History   Tobacco Use  . Smoking status: Former Smoker    Last attempt to quit: 05/09/1994    Years since quitting: 23.5  . Smokeless tobacco: Never Used  Substance Use Topics  . Alcohol use: No  . Drug use: No     Allergies   Patient has no known allergies.   Review of Systems Review of Systems  All other systems reviewed and are negative.    Physical Exam Triage Vital Signs ED Triage Vitals  Enc Vitals Group  BP 12/03/17 1316 (!) 145/97     Pulse Rate 12/03/17 1316 62     Resp 12/03/17 1316 18     Temp 12/03/17 1316 97.9 F (36.6 C)     Temp Source 12/03/17 1316 Oral     SpO2 12/03/17 1316 97 %     Weight 12/03/17 1317 250 lb (113.4 kg)     Height 12/03/17 1317 5' 10.5" (1.791 m)     Head Circumference --      Peak Flow --      Pain Score 12/03/17 1317 8     Pain Loc --      Pain Edu? --      Excl. in [place]? --    No data found.  Updated Vital Signs BP (!) 145/97 (BP Location: Right Arm)   Pulse 62   Temp 97.9 F (36.6 C) (Oral)   Resp 18   Ht 5' 10.5" (1.791 m)   Wt 250 lb (113.4 kg)   SpO2 97%   BMI 35.36 kg/m   Visual Acuity Right Eye Distance:   Left Eye Distance:   Bilateral Distance:    Right Eye Near:   Left  Eye Near:    Bilateral Near:     Physical Exam  Constitutional: He appears well-developed and well-nourished. No distress.  HENT:  Head: Atraumatic.  Eyes: Pupils are equal, round, and reactive to light.  Cardiovascular: Normal rate.  Pulmonary/Chest: Effort normal.  Musculoskeletal:       Right hand: He exhibits tenderness. He exhibits normal range of motion, no bony tenderness, normal two-point discrimination, normal capillary refill, no deformity and no swelling. Normal sensation noted.       Hands: Skin avulsion dorsal surface of distal second finger, approximately 2.5cm long by 0.15mm.  Distal sensation intact.  Finger has full range of motion all joints.  Wound appears clean without debris  Neurological: He is alert.  Skin: Skin is warm and dry.  Nursing note and vitals reviewed.    UC Treatments / Results  Labs (all labs ordered are listed, but only abnormal results are displayed) Labs Reviewed - No data to display  EKG None  Radiology No results found.  Procedures Procedures  Wound care right second finger:  Avulsed area cleaned with Betadine and normal saline.  Applied Bacitracin ointment followed by Mepelex Lite, secured with elastic gauze wrap and light wrap of [place]an  Medications Ordered in UC Medications  Tdap (BOOSTRIX) injection 0.5 mL (0.5 mLs Intramuscular Given 12/03/17 1329)    Initial Impression / Assessment and Plan / UC Course  I have reviewed the triage vital signs and the nursing notes.  Pertinent labs & imaging results that were available during my care of the patient were reviewed by me and considered in my medical decision making (see chart for details).    Administered Tdap  Rx for Lortab. Controlled Substance Prescriptions I have consulted the [place] Controlled Substances Registry for this patient, and feel the risk/benefit ratio today is favorable for proceeding with this prescription for a controlled substance.   Return in 48 hours for  dressing change.  At that time recommend repeat Mepelex Lite dressing; leave in place for approximately 3 days.  Discussed wound precautions.   Final Clinical Impressions(s) / UC Diagnoses   Final diagnoses:  Avulsion of skin of finger, initial encounter     Discharge Instructions     Elevate hand when possible.  May take Ibuprofen 200mg , 4 tabs every 8 hours with  food.  Keep bandage clean and dry, and leave bandage in place until follow-up visit.    ED Prescriptions    Medication Sig Dispense Auth. Provider   HYDROcodone-acetaminophen (NORCO/VICODIN) 5-325 MG tablet  (Status: Discontinued) Take one by mouth at bedtime as needed for pain 10 tablet [name], MD   HYDROcodone-acetaminophen (NORCO/VICODIN) 5-325 MG tablet Take one by mouth at bedtime as needed for pain 5 tablet [name], MD        [name], MD 12/06/17 1233

## 2017-12-03 NOTE — Discharge Instructions (Addendum)
Elevate hand when possible.  May take Ibuprofen 200mg , 4 tabs every 8 hours with food.  Keep bandage clean and dry, and leave bandage in place until follow-up visit.

## 2017-12-05 ENCOUNTER — Emergency Department: Admission: EM | Admit: 2017-12-05 | Discharge: 2017-12-05 | Payer: Medicare Other | Source: Home / Self Care

## 2017-12-13 ENCOUNTER — Other Ambulatory Visit: Payer: Self-pay

## 2017-12-13 ENCOUNTER — Encounter: Payer: Self-pay | Admitting: *Deleted

## 2017-12-13 ENCOUNTER — Emergency Department (INDEPENDENT_AMBULATORY_CARE_PROVIDER_SITE_OTHER)
Admission: EM | Admit: 2017-12-13 | Discharge: 2017-12-13 | Disposition: A | Discharge status: Home / Self Care | Payer: Medicare Other | Source: Home / Self Care

## 2017-12-13 DIAGNOSIS — S6991XD Unspecified injury of right wrist, hand and finger(s), subsequent encounter: Secondary | ICD-10-CM | POA: Diagnosis not present

## 2017-12-13 MED ORDER — DOXYCYCLINE HYCLATE 100 MG PO CAPS: 100.0000 mg | ORAL_CAPSULE | Freq: Two times a day (BID) | ORAL | 0 refills | Status: DC

## 2017-12-13 MED FILL — PRAMIPEXOLE 0.5 MG TABLET: 0.5 | 30 days supply | Qty: 60 | Fill #1

## 2017-12-13 MED FILL — DOXYCYCLINE HYCLATE 100 MG: 100 | 10 days supply | Qty: 20 | Fill #0

## 2017-12-13 MED FILL — GABAPENTIN 300 MG CAPSULE: 300 | 30 days supply | Qty: 90 | Fill #0

## 2017-12-13 NOTE — Discharge Instructions (Addendum)
Continue to wash a couple of times a day and put the Polysporin ointment on.  Leave it open to the air for an hour or so a couple of times a day before redressing it.  Keep the wound clean  Doxycycline 100 mg 1 twice daily.  Be cautious because some people will sunburn easier when on the doxycycline.  If it is starting to look worse with increasing redness or infection get rechecked.  If it is not improved considerably over the next 10 to 14 days come back for another recheck.

## 2017-12-13 NOTE — ED Triage Notes (Signed)
Pt is here today for a recheck of his RT 2nd finger injury.

## 2017-12-13 NOTE — ED Provider Notes (Signed)
[facility] Solis [facility]    CSN: [id number] Arrival date & time: 12/13/17  0830     History   Chief Complaint Chief Complaint  Patient presents with  . Finger Injury    HPI David Solis is a 67 y.o. male.   HPI Patient is here for the follow-up from that wound on his finger.  He had hurt it with a model airplane, taking off the skin on the mid and distal phalanx areas medial to the nail of the right index finger.  He had seen Dr. London Sheer for this 2 weeks ago.  He has been using bacitracin ointment and keeping it clean and wrapped.  He is here for recheck because it is continuing to persist as a wound.  He says the pain still is a 6-8. Past Medical History:  Diagnosis Date  . Asthma   . Depression   . Duodenal ulcer disease   . GERD (gastroesophageal reflux disease)   . Neuropathy    in legs  . Restless leg     Patient Active Problem List   Diagnosis Date Noted  . Nausea & vomiting 05/10/2012  . Chronic abdominal pain 05/10/2012  . Depression 05/09/2012  . Duodenal ulcer 05/09/2012  . GERD (gastroesophageal reflux disease) 05/09/2012  . Neuropathy of lower extremity 05/09/2012  . Restless leg syndrome 05/09/2012  . Gastric outlet obstruction 05/09/2012    Past Surgical History:  Procedure Laterality Date  . BACK SURGERY     cervical fusion   . CERVICAL FUSION    . CHOLECYSTECTOMY    . ESOPHAGOGASTRODUODENOSCOPY  05/10/2012   Procedure: ESOPHAGOGASTRODUODENOSCOPY (EGD);  Surgeon: Beryle Beams, MD;  Location: [facility];  Service: Endoscopy;  Laterality: N/A;       Home Medications    Prior to Admission medications   Medication Sig Start Date End Date Taking? Authorizing Provider  gabapentin (NEURONTIN) 300 MG capsule Take 300 mg by mouth 3 (three) times daily.     [provider]  HYDROcodone-acetaminophen (NORCO/VICODIN) 5-325 MG tablet Take one by mouth at bedtime as needed for pain 12/03/17   [name], MD  LamoTRIgine (LAMICTAL PO) Take by  mouth.    [provider]  Mirtazapine (REMERON PO) Take by mouth.    [provider]  TRAZODONE HCL PO Take by mouth.    [provider]    Family History Family History  Problem Relation Age of Onset  . Cancer Mother        breast    Social History Social History   Tobacco Use  . Smoking status: Former Smoker    Last attempt to quit: 05/09/1994    Years since quitting: 23.6  . Smokeless tobacco: Never Used  Substance Use Topics  . Alcohol use: No  . Drug use: No     Allergies   Patient has no known allergies.   Review of Systems Review of Systems No other complaints  Physical Exam Triage Vital Signs ED Triage Vitals  Enc Vitals Group     BP 12/13/17 0903 (!) 151/100     Pulse Rate 12/13/17 0903 67     Resp 12/13/17 0903 18     Temp 12/13/17 0903 (!) 96.5 F (35.8 C)     Temp Source 12/13/17 0903 Tympanic     SpO2 12/13/17 0903 96 %     Weight 12/13/17 0904 243 lb (110.2 kg)     Height 12/13/17 0904 5\' 10"  (1.778 m)  Head Circumference --      Peak Flow --      Pain Score 12/13/17 0904 6     Pain Loc --      Pain Edu? --      Excl. in [place]? --    No data found.  Updated Vital Signs BP (!) 151/100 (BP Location: Right Arm)   Pulse 67   Temp (!) 96.5 F (35.8 C) (Tympanic)   Resp 18   Ht 5\' 10"  (1.778 m)   Wt 243 lb (110.2 kg)   SpO2 96%   BMI 34.87 kg/m   Visual Acuity Right Eye Distance:   Left Eye Distance:   Bilateral Distance:    Right Eye Near:   Left Eye Near:    Bilateral Near:     Physical Exam Wound extends in the area as described above.  The area over the distal phalanx is dry with a little crusting.  The area over the mid phalanx is raw with some granulation tissue about 2 cm x 1 cm.  Proximal to that is an area of ecchymoses which is also tender.  The whole area is mildly red but not obviously infected.  Swab culture was taken.  UC Treatments / Results  Labs (all labs ordered are listed, but only  abnormal results are displayed) Labs Reviewed - No data to display  EKG None  Radiology No results found.  Procedures Procedures (including critical care time)  Medications Ordered in UC Medications - No data to display  Initial Impression / Assessment and Plan / UC Course  I have reviewed the triage vital signs and the nursing notes.  Pertinent labs & imaging results that were available during my care of the patient were reviewed by me and considered in my medical decision making (see chart for details).     We will go ahead and put him on some doxycycline, and he is to return if not continuing to improve. Final Clinical Impressions(s) / UC Diagnoses   Final diagnoses:  Injury of finger of right hand, subsequent encounter     Discharge Instructions     Continue to wash a couple of times a day and put the Polysporin ointment on.  Leave it open to the air for an hour or so a couple of times a day before redressing it.  Keep the wound clean  Doxycycline 100 mg 1 twice daily.  Be cautious because some people will sunburn easier when on the doxycycline.  If it is starting to look worse with increasing redness or infection get rechecked.  If it is not improved considerably over the next 10 to 14 days come back for another recheck.    ED Prescriptions    None     Controlled Substance Prescriptions [place] Controlled Substance Registry consulted? No   Posey Boyer, MD 12/13/17 [phone]

## 2017-12-16 ENCOUNTER — Telehealth: Payer: Self-pay

## 2017-12-16 LAB — WOUND CULTURE
MICRO NUMBER:: 90781282
SPECIMEN QUALITY:: ADEQUATE

## 2017-12-16 MED ORDER — CEPHALEXIN 500 MG PO CAPS: 500.0000 mg | ORAL_CAPSULE | Freq: Two times a day (BID) | ORAL | 0 refills | Status: DC

## 2017-12-16 NOTE — Telephone Encounter (Signed)
Finger feeling better.  Gave results of wound cx. Spoke with Tilden Fossa, and she is going to change antibiotic due to skin burning.

## 2017-12-17 MED FILL — CEPHALEXIN 500 MG CAPSULE: 500 | 7 days supply | Qty: 14 | Fill #0

## 2017-12-21 ENCOUNTER — Encounter: Payer: Self-pay | Admitting: Emergency Medicine

## 2017-12-21 ENCOUNTER — Emergency Department (INDEPENDENT_AMBULATORY_CARE_PROVIDER_SITE_OTHER)
Admission: EM | Admit: 2017-12-21 | Discharge: 2017-12-21 | Disposition: A | Discharge status: Home / Self Care | Payer: Medicare Other | Source: Home / Self Care | Attending: Family Medicine | Admitting: Family Medicine

## 2017-12-21 ENCOUNTER — Other Ambulatory Visit: Payer: Self-pay

## 2017-12-21 ENCOUNTER — Emergency Department (INDEPENDENT_AMBULATORY_CARE_PROVIDER_SITE_OTHER): Payer: Medicare Other

## 2017-12-21 DIAGNOSIS — X58XXXA Exposure to other specified factors, initial encounter: Secondary | ICD-10-CM | POA: Diagnosis not present

## 2017-12-21 DIAGNOSIS — L089 Local infection of the skin and subcutaneous tissue, unspecified: Secondary | ICD-10-CM

## 2017-12-21 DIAGNOSIS — S60410A Abrasion of right index finger, initial encounter: Secondary | ICD-10-CM | POA: Diagnosis not present

## 2017-12-21 MED ORDER — MUPIROCIN 2 % EX OINT: TOPICAL_OINTMENT | CUTANEOUS | 0 refills | Status: DC

## 2017-12-21 MED ORDER — AZITHROMYCIN 250 MG PO TABS: 250.0000 mg | ORAL_TABLET | Freq: Every day | ORAL | 0 refills | Status: DC

## 2017-12-21 MED FILL — MUPIROCIN 2% OINTMENT: 2 | 5 days supply | Qty: 22 | Fill #0

## 2017-12-21 MED FILL — AZITHROMYCIN 250 MG TABLET: 250 | 5 days supply | Qty: 6 | Fill #0

## 2017-12-21 NOTE — Discharge Instructions (Signed)
°  Please return for a wound recheck in 2-3 days for recheck of symptoms, sooner if concern for significant worsening of symptoms or if you develop any new symptoms.

## 2017-12-21 NOTE — ED Provider Notes (Signed)
[facility]    CSN: [id number] Arrival date & time: 12/21/17  1134     History   Chief Complaint Chief Complaint  Patient presents with  . finger infection    HPI David Solis is a 67 y.o. male.   HPI  David Solis is a 67 y.o. male presenting to UC with c/o persistent wound to Right index finger that initially started on 12/03/17 after he avulsed a layer of skin while working on a model plane.  He was seen on 12/13/17 for worsening finger pain, tx with Doxycycline. He developed full body itching and burning he believed due to the doxycycline. He was advised to discontinue the doxycyline and start Keflex but states his finger is still not improving, pt concerned wound is worsening. Pain is aching and sore, burning at times. No fever or chills. He is left hand dominant. No hx of diabetes.    Past Medical History:  Diagnosis Date  . Asthma   . Depression   . Duodenal ulcer disease   . GERD (gastroesophageal reflux disease)   . Neuropathy    in legs  . Restless leg     Patient Active Problem List   Diagnosis Date Noted  . Nausea & vomiting 05/10/2012  . Chronic abdominal pain 05/10/2012  . Depression 05/09/2012  . Duodenal ulcer 05/09/2012  . GERD (gastroesophageal reflux disease) 05/09/2012  . Neuropathy of lower extremity 05/09/2012  . Restless leg syndrome 05/09/2012  . Gastric outlet obstruction 05/09/2012    Past Surgical History:  Procedure Laterality Date  . BACK SURGERY     cervical fusion   . CERVICAL FUSION    . CHOLECYSTECTOMY    . ESOPHAGOGASTRODUODENOSCOPY  05/10/2012   Procedure: ESOPHAGOGASTRODUODENOSCOPY (EGD);  Surgeon: Beryle Beams, MD;  Location: [facility];  Service: Endoscopy;  Laterality: N/A;       Home Medications    Prior to Admission medications   Medication Sig Start Date End Date Taking? Authorizing Provider  azithromycin (ZITHROMAX) 250 MG tablet Take 1 tablet (250 mg total) by mouth daily. Take first 2 tablets together,  then 1 every day until finished. 12/21/17   Noe Gens, PA-C  cephALEXin (KEFLEX) 500 MG capsule Take 1 capsule (500 mg total) by mouth 2 (two) times daily. 12/16/17   Noe Gens, PA-C  gabapentin (NEURONTIN) 300 MG capsule Take 300 mg by mouth 3 (three) times daily.     [provider]  HYDROcodone-acetaminophen (NORCO/VICODIN) 5-325 MG tablet Take one by mouth at bedtime as needed for pain 12/03/17   [name], MD  LamoTRIgine (LAMICTAL PO) Take by mouth.    [provider]  Mirtazapine (REMERON PO) Take by mouth.    [provider]  mupirocin ointment (BACTROBAN) 2 % Apply to finger twice daily for 5 days 12/21/17   Noe Gens, PA-C  TRAZODONE HCL PO Take by mouth.    [provider]    Family History Family History  Problem Relation Age of Onset  . Cancer Mother        breast    Social History Social History   Tobacco Use  . Smoking status: Former Smoker    Last attempt to quit: 05/09/1994    Years since quitting: 23.6  . Smokeless tobacco: Never Used  Substance Use Topics  . Alcohol use: No  . Drug use: No     Allergies   Patient has no known allergies.   Review of Systems Review of Systems  Musculoskeletal: Positive for arthralgias and joint swelling.  Skin: Positive for color change and wound.     Physical Exam Triage Vital Signs ED Triage Vitals  Enc Vitals Group     BP 12/21/17 1151 (!) 149/94     Pulse Rate 12/21/17 1151 69     Resp --      Temp 12/21/17 1151 97.6 F (36.4 C)     Temp Source 12/21/17 1151 Oral     SpO2 12/21/17 1151 96 %     Weight 12/21/17 1153 242 lb (109.8 kg)     Height 12/21/17 1153 5\' 10"  (1.778 m)     Head Circumference --      Peak Flow --      Pain Score 12/21/17 1152 6     Pain Loc --      Pain Edu? --      Excl. in [place]? --    No data found.  Updated Vital Signs BP (!) 149/94 (BP Location: Right Arm)   Pulse 69   Temp 97.6 F (36.4 C) (Oral)   Ht 5\' 10"  (1.778 m)    Wt 109.8 kg (242 lb)   SpO2 96%   BMI 34.72 kg/m   Visual Acuity Right Eye Distance:   Left Eye Distance:   Bilateral Distance:    Right Eye Near:   Left Eye Near:    Bilateral Near:     Physical Exam  Constitutional: He is oriented to person, place, and time. He appears well-developed and well-nourished.  HENT:  Head: Normocephalic and atraumatic.  Eyes: EOM are normal.  Neck: Normal range of motion.  Cardiovascular: Normal rate.  Pulmonary/Chest: Effort normal.  Musculoskeletal: He exhibits edema and tenderness.       Hands: Right index finger: mild to moderate edema, slight decreased flexion due to swelling. Tender.   Neurological: He is alert and oriented to person, place, and time.  Skin: Skin is warm and dry.  Right index finger, radial aspect: area avulsed skin from side of nailbed crossing DIP joint into middle phalanx, superficial wound with granulation tissue and surrounding erythematous moist skin. Tender.  Psychiatric: He has a normal mood and affect. His behavior is normal.  Nursing note and vitals reviewed.    UC Treatments / Results  Labs (all labs ordered are listed, but only abnormal results are displayed) Labs Reviewed - No data to display  EKG None  Radiology Dg Hand Complete Right  Result Date: 12/21/2017 CLINICAL DATA:  Abrasion of right index finger with infection EXAM: RIGHT HAND - COMPLETE 3+ VIEW COMPARISON:  None. FINDINGS: There is no evidence of fracture or dislocation. There is no evidence of arthropathy or other focal bone abnormality. Soft tissues are unremarkable. IMPRESSION: No significant abnormality seen in the right hand. Electronically Signed   By: Marijo Conception, M.D.   On: 12/21/2017 12:13    Procedures Procedures (including critical care time)  Medications Ordered in UC Medications - No data to display  Initial Impression / Assessment and Plan / UC Course  I have reviewed the triage vital signs and the nursing  notes.  Pertinent labs & imaging results that were available during my care of the patient were reviewed by me and considered in my medical decision making (see chart for details).    Plain films performed to ensure no bone injury or infection. Plain films- normal  Will have pt start Azithromycin and mupiroxin ointment. Strongly encouraged close f/u   Final Clinical Impressions(s) /  UC Diagnoses   Final diagnoses:  Abrasion of right index finger with infection     Discharge Instructions      Please return for a wound recheck in 2-3 days for recheck of symptoms, sooner if concern for significant worsening of symptoms or if you develop any new symptoms.     ED Prescriptions    Medication Sig Dispense Auth. Provider   azithromycin (ZITHROMAX) 250 MG tablet Take 1 tablet (250 mg total) by mouth daily. Take first 2 tablets together, then 1 every day until finished. 6 tablet Noe Gens, PA-C   mupirocin ointment (BACTROBAN) 2 % Apply to finger twice daily for 5 days 22 g Noe Gens, [place]     Controlled Substance Prescriptions [place] Controlled Substance Registry consulted? Not Applicable   Tyrell Antonio 12/22/17 2774

## 2017-12-24 ENCOUNTER — Emergency Department (INDEPENDENT_AMBULATORY_CARE_PROVIDER_SITE_OTHER)
Admission: EM | Admit: 2017-12-24 | Discharge: 2017-12-24 | Disposition: A | Discharge status: Home / Self Care | Payer: Medicare Other | Source: Home / Self Care | Attending: Family Medicine | Admitting: Family Medicine

## 2017-12-24 ENCOUNTER — Other Ambulatory Visit: Payer: Self-pay

## 2017-12-24 DIAGNOSIS — L089 Local infection of the skin and subcutaneous tissue, unspecified: Secondary | ICD-10-CM

## 2017-12-24 DIAGNOSIS — Z5189 Encounter for other specified aftercare: Secondary | ICD-10-CM

## 2017-12-24 DIAGNOSIS — S60410A Abrasion of right index finger, initial encounter: Secondary | ICD-10-CM

## 2017-12-24 MED ORDER — CICLOPIROX OLAMINE 0.77 % EX CREA: TOPICAL_CREAM | CUTANEOUS | 0 refills | Status: DC

## 2017-12-24 MED FILL — CICLOPIROX 0.77% CREAM: 0.77 | 15 days supply | Qty: 30 | Fill #0

## 2017-12-24 NOTE — ED Provider Notes (Signed)
[name]    CSN: [id number] Arrival date & time: 12/24/17  0835     History   Chief Complaint Chief Complaint  Patient presents with  . Wound Check    HPI David Solis is a 66 y.o. male.   Patient returns for follow-up of abrasion/cellulitis to the dorsal right second finger.  He is finishing up azithromycin today, and continues to apply Bactroban ointment.  He reports that his fingertip is still painful (5 out of 10), and he continues to have drainage from the wound.  He reports extension of erythema over the dorsal middle phalanx during the past 24 hours.  The history is provided by the patient.    Past Medical History:  Diagnosis Date  . Asthma   . Depression   . Duodenal ulcer disease   . GERD (gastroesophageal reflux disease)   . Neuropathy    in legs  . Restless leg     Patient Active Problem List   Diagnosis Date Noted  . Nausea & vomiting 05/10/2012  . Chronic abdominal pain 05/10/2012  . Depression 05/09/2012  . Duodenal ulcer 05/09/2012  . GERD (gastroesophageal reflux disease) 05/09/2012  . Neuropathy of lower extremity 05/09/2012  . Restless leg syndrome 05/09/2012  . Gastric outlet obstruction 05/09/2012    Past Surgical History:  Procedure Laterality Date  . BACK SURGERY     cervical fusion   . CERVICAL FUSION    . CHOLECYSTECTOMY    . ESOPHAGOGASTRODUODENOSCOPY  05/10/2012   Procedure: ESOPHAGOGASTRODUODENOSCOPY (EGD);  Surgeon: Beryle Beams, MD;  Location: [facility];  Service: Endoscopy;  Laterality: N/A;       Home Medications    Prior to Admission medications   Medication Sig Start Date End Date Taking? Authorizing Provider  azithromycin (ZITHROMAX) 250 MG tablet Take 1 tablet (250 mg total) by mouth daily. Take first 2 tablets together, then 1 every day until finished. 12/21/17   Noe Gens, PA-C  cephALEXin (KEFLEX) 500 MG capsule Take 1 capsule (500 mg total) by mouth 2 (two) times daily. 12/16/17   Noe Gens, PA-C  ciclopirox (LOPROX) 0.77 % cream Apply thin film BID 12/24/17   [name], MD  gabapentin (NEURONTIN) 300 MG capsule Take 300 mg by mouth 3 (three) times daily.     [provider]  HYDROcodone-acetaminophen (NORCO/VICODIN) 5-325 MG tablet Take one by mouth at bedtime as needed for pain 12/03/17   [name], MD  LamoTRIgine (LAMICTAL PO) Take by mouth.    [provider]  Mirtazapine (REMERON PO) Take by mouth.    [provider]  mupirocin ointment (BACTROBAN) 2 % Apply to finger twice daily for 5 days 12/21/17   Noe Gens, PA-C  TRAZODONE HCL PO Take by mouth.    [provider]    Family History Family History  Problem Relation Age of Onset  . Cancer Mother        breast    Social History Social History   Tobacco Use  . Smoking status: Former Smoker    Last attempt to quit: 05/09/1994    Years since quitting: 23.6  . Smokeless tobacco: Never Used  Substance Use Topics  . Alcohol use: No  . Drug use: No     Allergies   Patient has no known allergies.   Review of Systems Review of Systems  Constitutional: Negative for chills and diaphoresis.  Musculoskeletal: Negative for joint swelling.  Skin: Positive for color  change and wound.  All other systems reviewed and are negative.    Physical Exam Triage Vital Signs ED Triage Vitals [12/24/17 0847]  Enc Vitals Group     BP (!) 164/98     Pulse Rate 77     Resp      Temp (!) 97.5 F (36.4 C)     Temp Source Oral     SpO2 97 %     Weight 242 lb (109.8 kg)     Height 5\' 10"  (1.778 m)     Head Circumference      Peak Flow      Pain Score 5     Pain Loc      Pain Edu?      Excl. in [place]?    No data found.  Updated Vital Signs BP (!) 164/98 (BP Location: Right Arm)   Pulse 77   Temp (!) 97.5 F (36.4 C) (Oral)   Ht 5\' 10"  (1.778 m)   Wt 242 lb (109.8 kg)   SpO2 97%   BMI 34.72 kg/m   Visual Acuity Right Eye Distance:   Left Eye  Distance:   Bilateral Distance:    Right Eye Near:   Left Eye Near:    Bilateral Near:     Physical Exam  Constitutional: He appears well-developed and well-nourished. No distress.  HENT:  Head: Normocephalic.  Eyes: Pupils are equal, round, and reactive to light.  Cardiovascular: Normal rate.  Pulmonary/Chest: Effort normal.  Musculoskeletal:       Hands: Dorsum of right second finger distal phalanx has moist eschar present.  There is erythema extending into intact skin over the dorsal middle phalanx, suggestive of fungal infection.  Neurological: He is alert.  Skin: Skin is warm and dry.  Nursing note and vitals reviewed.    UC Treatments / Results  Labs (all labs ordered are listed, but only abnormal results are displayed) Labs Reviewed  WOUND CULTURE  POCT KOH prep (right second finger wound):  Positive fungal elements.  EKG None  Radiology No results found.  Procedures Procedures (including critical care time)  Medications Ordered in UC Medications - No data to display  Initial Impression / Assessment and Plan / UC Course  I have reviewed the triage vital signs and the nursing notes.  Pertinent labs & imaging results that were available during my care of the patient were reviewed by me and considered in my medical decision making (see chart for details).    KOH prep reveals fungal elements.  Suspect secondary fungal infection.  Will begin ciclopirox cream BID. Wound dressed with Bacitracin and light Telfa bandage. Wound culture pending. Will refer patient to wound care center for definitive treatment.   Final Clinical Impressions(s) / UC Diagnoses   Final diagnoses:  Abrasion of right index finger with infection  Visit for wound check     Discharge Instructions     Finish Azithromycin.  Begin Ciclopirox antifungal cream.  Change dressing 2 or 3 times daily using a non-stick gauze such as Telfa.  Continue Bactroban ointment.   ED Prescriptions     Medication Sig Dispense Auth. Provider   ciclopirox (LOPROX) 0.77 % cream Apply thin film BID 30 g [name], MD        [name], MD 12/24/17 [phone]

## 2017-12-24 NOTE — ED Triage Notes (Signed)
Pt was seen here at [facility] 7/9 for an infected R index finger. Here today for recheck.

## 2017-12-24 NOTE — Discharge Instructions (Addendum)
Finish Azithromycin.  Begin Ciclopirox antifungal cream.  Change dressing 2 or 3 times daily using a non-stick gauze such as Telfa.  Continue Bactroban ointment.

## 2017-12-27 LAB — WOUND CULTURE
MICRO NUMBER:: 90828326
RESULT: NO GROWTH
SPECIMEN QUALITY:: ADEQUATE

## 2017-12-27 NOTE — Telephone Encounter (Signed)
Notified patient of lab results.  Has an appointment with wound care tomorrow.

## 2017-12-28 DIAGNOSIS — S60419A Abrasion of unspecified finger, initial encounter: Secondary | ICD-10-CM | POA: Diagnosis not present

## 2017-12-28 DIAGNOSIS — L089 Local infection of the skin and subcutaneous tissue, unspecified: Secondary | ICD-10-CM | POA: Diagnosis not present

## 2017-12-28 DIAGNOSIS — S60410A Abrasion of right index finger, initial encounter: Secondary | ICD-10-CM | POA: Diagnosis not present

## 2018-01-04 DIAGNOSIS — L03011 Cellulitis of right finger: Secondary | ICD-10-CM | POA: Diagnosis not present

## 2018-01-06 MED FILL — GABAPENTIN 400 MG CAPSULE: 400 | 30 days supply | Qty: 90 | Fill #4

## 2018-01-18 MED FILL — PRAMIPEXOLE 0.5 MG TABLET: 0.5 | 30 days supply | Qty: 60 | Fill #2

## 2018-01-18 MED FILL — lamoTRIgine 100 MG TABS: 100 | 30 days supply | Qty: 30 | Fill #3

## 2018-01-18 MED FILL — MIRTAZAPINE 15 MG TABLET: 15 | 30 days supply | Qty: 30 | Fill #4

## 2018-01-25 MED FILL — GABAPENTIN 300 MG CAPSULE: 300 | 30 days supply | Qty: 90 | Fill #1

## 2018-01-26 DIAGNOSIS — F332 Major depressive disorder, recurrent severe without psychotic features: Secondary | ICD-10-CM | POA: Diagnosis not present

## 2018-01-26 DIAGNOSIS — F451 Undifferentiated somatoform disorder: Secondary | ICD-10-CM | POA: Diagnosis not present

## 2018-02-04 MED FILL — traZODone HCL 100 MG TABS: 100 | 30 days supply | Qty: 75 | Fill #0

## 2018-02-23 MED FILL — PRAMIPEXOLE 0.5 MG TABLET: 0.5 | 30 days supply | Qty: 60 | Fill #3

## 2018-02-23 MED FILL — GABAPENTIN 400 MG CAPSULE: 400 | 30 days supply | Qty: 90 | Fill #5

## 2018-03-07 MED FILL — MIRTAZAPINE 15 MG TABLET: 15 | 30 days supply | Qty: 30 | Fill #5

## 2018-03-07 MED FILL — lamoTRIgine 100 MG TABS: 100 | 30 days supply | Qty: 30 | Fill #4

## 2018-03-10 MED FILL — GABAPENTIN 300 MG CAPSULE: 300 | 30 days supply | Qty: 90 | Fill #2

## 2018-03-30 MED FILL — PRAMIPEXOLE 0.5 MG TABLET: 0.5 | 30 days supply | Qty: 60 | Fill #4

## 2018-04-13 MED FILL — traZODone HCL 100 MG TABS: 100 | 30 days supply | Qty: 75 | Fill #1

## 2018-04-13 MED FILL — GABAPENTIN 400 MG CAPSULE: 400 | 30 days supply | Qty: 90 | Fill #0

## 2018-04-18 DIAGNOSIS — R69 Illness, unspecified: Secondary | ICD-10-CM | POA: Diagnosis not present

## 2018-04-18 DIAGNOSIS — F451 Undifferentiated somatoform disorder: Secondary | ICD-10-CM | POA: Diagnosis not present

## 2018-04-18 DIAGNOSIS — G2581 Restless legs syndrome: Secondary | ICD-10-CM | POA: Diagnosis not present

## 2018-04-18 DIAGNOSIS — F332 Major depressive disorder, recurrent severe without psychotic features: Secondary | ICD-10-CM | POA: Diagnosis not present

## 2018-05-03 MED FILL — PRAMIPEXOLE 0.5 MG TABLET: 0.5 | 30 days supply | Qty: 60 | Fill #5

## 2018-05-03 MED FILL — GABAPENTIN 300 MG CAPSULE: 300 | 30 days supply | Qty: 90 | Fill #3

## 2018-05-03 MED FILL — MIRTAZAPINE 15 MG TABLET: 15 | 30 days supply | Qty: 30 | Fill #0

## 2018-05-03 MED FILL — lamoTRIgine 100 MG TABS: 100 | 30 days supply | Qty: 30 | Fill #5

## 2018-06-02 MED FILL — GABAPENTIN 400 MG CAPS: 400 | 30 days supply | Qty: 90 | Fill #1

## 2018-06-09 MED FILL — PRAMIPEXOLE 0.5 MG TABLET: 0.5 | 30 days supply | Qty: 60 | Fill #0

## 2018-06-16 MED FILL — GABAPENTIN 300 MG CAPSULE: 300 | 30 days supply | Qty: 90 | Fill #4

## 2018-06-16 MED FILL — traZODone HCL 100 MG TABS: 100 | 30 days supply | Qty: 75 | Fill #2

## 2018-06-16 MED FILL — MIRTAZAPINE 15 MG TABLET: 15 | 30 days supply | Qty: 30 | Fill #1

## 2018-06-16 MED FILL — lamoTRIgine 100 MG TABS: 100 | 30 days supply | Qty: 30 | Fill #0

## 2018-07-12 DIAGNOSIS — H16042 Marginal corneal ulcer, left eye: Secondary | ICD-10-CM | POA: Diagnosis not present

## 2018-07-12 MED FILL — PRAMIPEXOLE 0.5 MG TABLET: 0.5 | 30 days supply | Qty: 60 | Fill #1

## 2018-07-13 DIAGNOSIS — F451 Undifferentiated somatoform disorder: Secondary | ICD-10-CM | POA: Diagnosis not present

## 2018-07-13 DIAGNOSIS — F332 Major depressive disorder, recurrent severe without psychotic features: Secondary | ICD-10-CM | POA: Diagnosis not present

## 2018-07-13 DIAGNOSIS — R69 Illness, unspecified: Secondary | ICD-10-CM | POA: Diagnosis not present

## 2018-07-13 DIAGNOSIS — G2581 Restless legs syndrome: Secondary | ICD-10-CM | POA: Diagnosis not present

## 2018-07-13 MED FILL — MIRTAZAPINE 15 MG TABLET: 15 | 30 days supply | Qty: 30 | Fill #0

## 2018-07-13 MED FILL — traZODone HCL 100 MG TABS: 100 | 30 days supply | Qty: 75 | Fill #0

## 2018-07-13 MED FILL — lamoTRIgine 100 MG TABS: 100 | 30 days supply | Qty: 30 | Fill #0

## 2018-07-13 MED FILL — GABAPENTIN 400 MG CAPS: 400 | 30 days supply | Qty: 90 | Fill #0

## 2018-07-13 MED FILL — GABAPENTIN 300 MG CAPSULE: 300 | 30 days supply | Qty: 90 | Fill #0

## 2018-08-08 IMAGING — DX DG HIP (WITH OR WITHOUT PELVIS) 2-3V*R*
3 series · 3 of 3 positions shown · non-contrast
Comparison: None.

CLINICAL DATA: Low back, right hip and knee pain for 2 days after
exercising.

EXAM:
DG HIP (WITH OR WITHOUT PELVIS) 2-3V RIGHT

[pelvis ap]
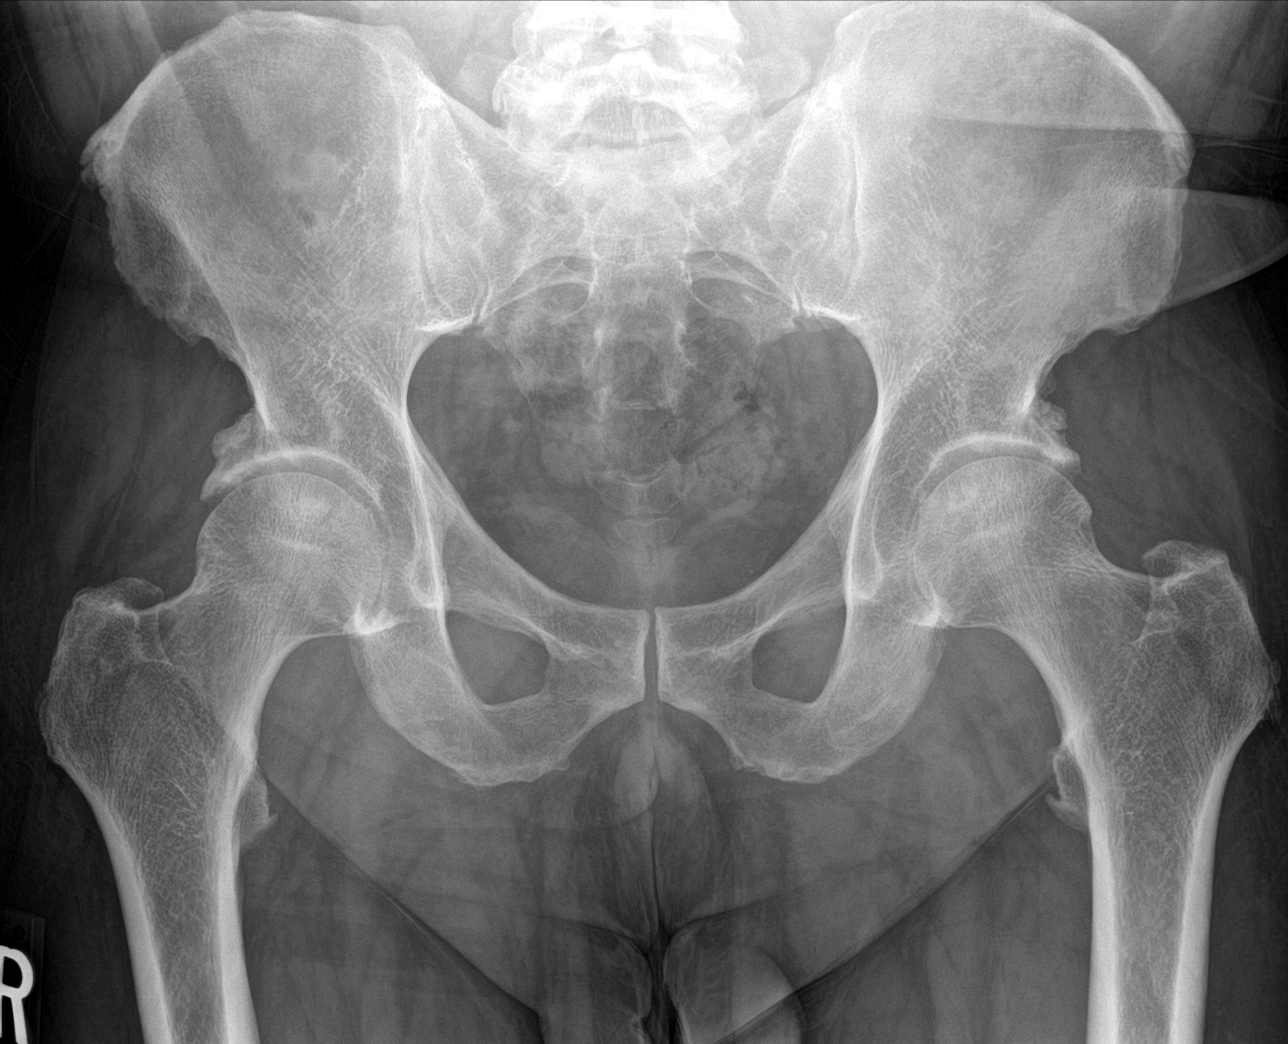

[hip ap]
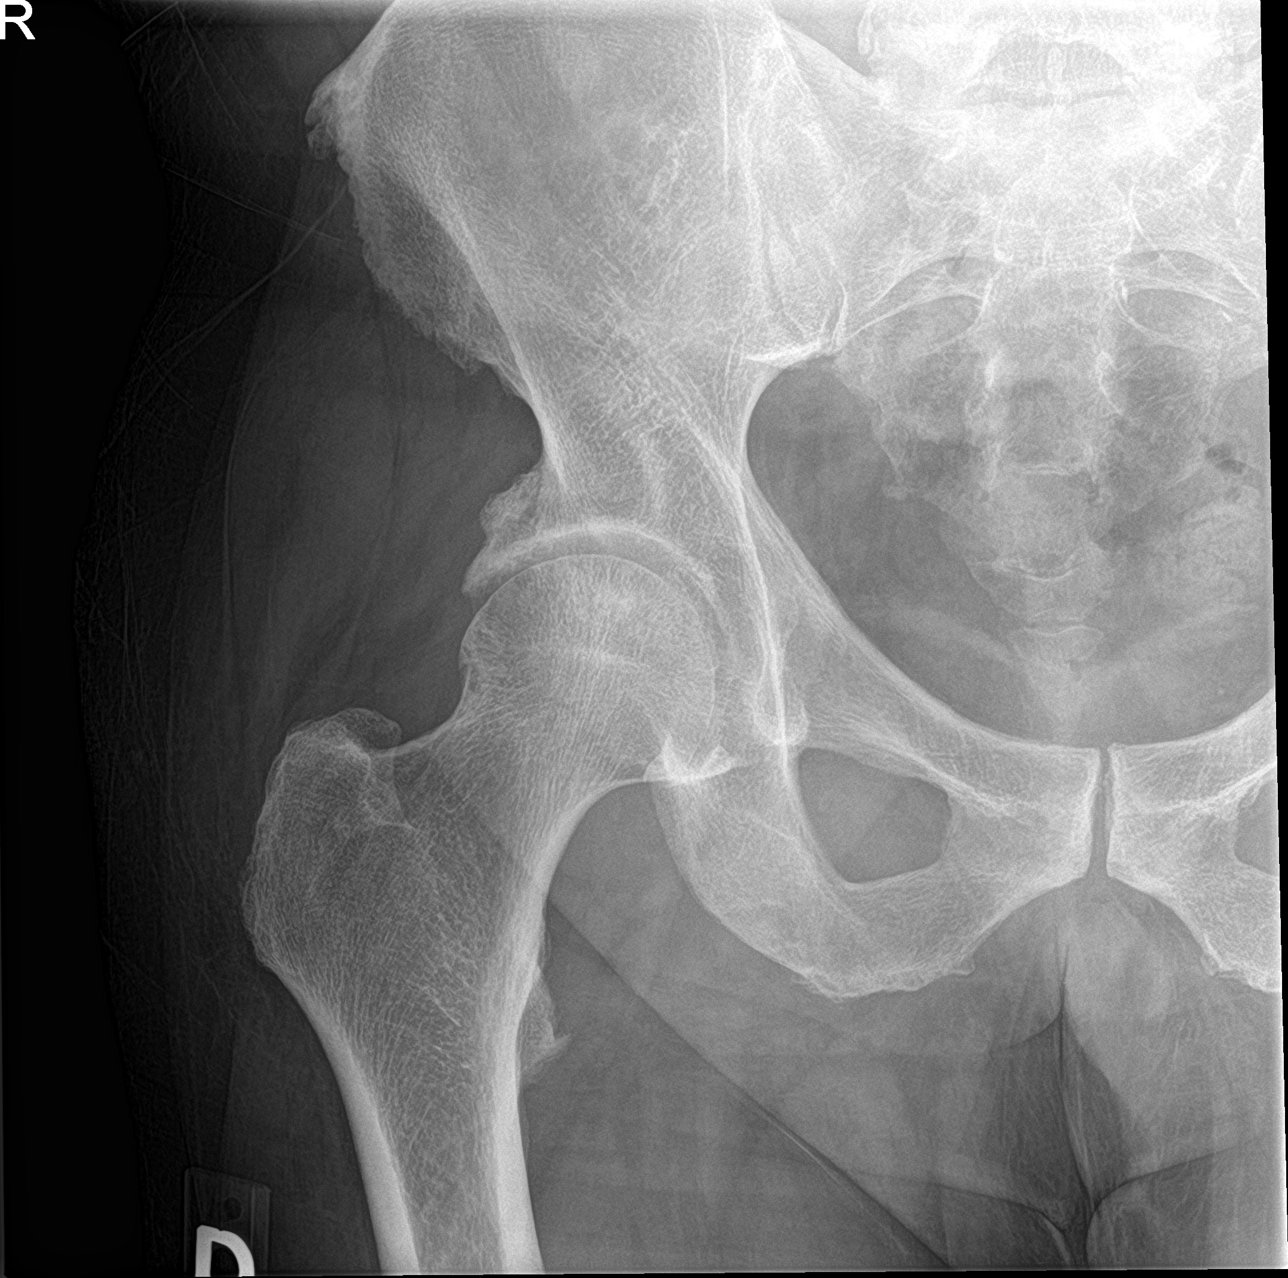

[hip lat]
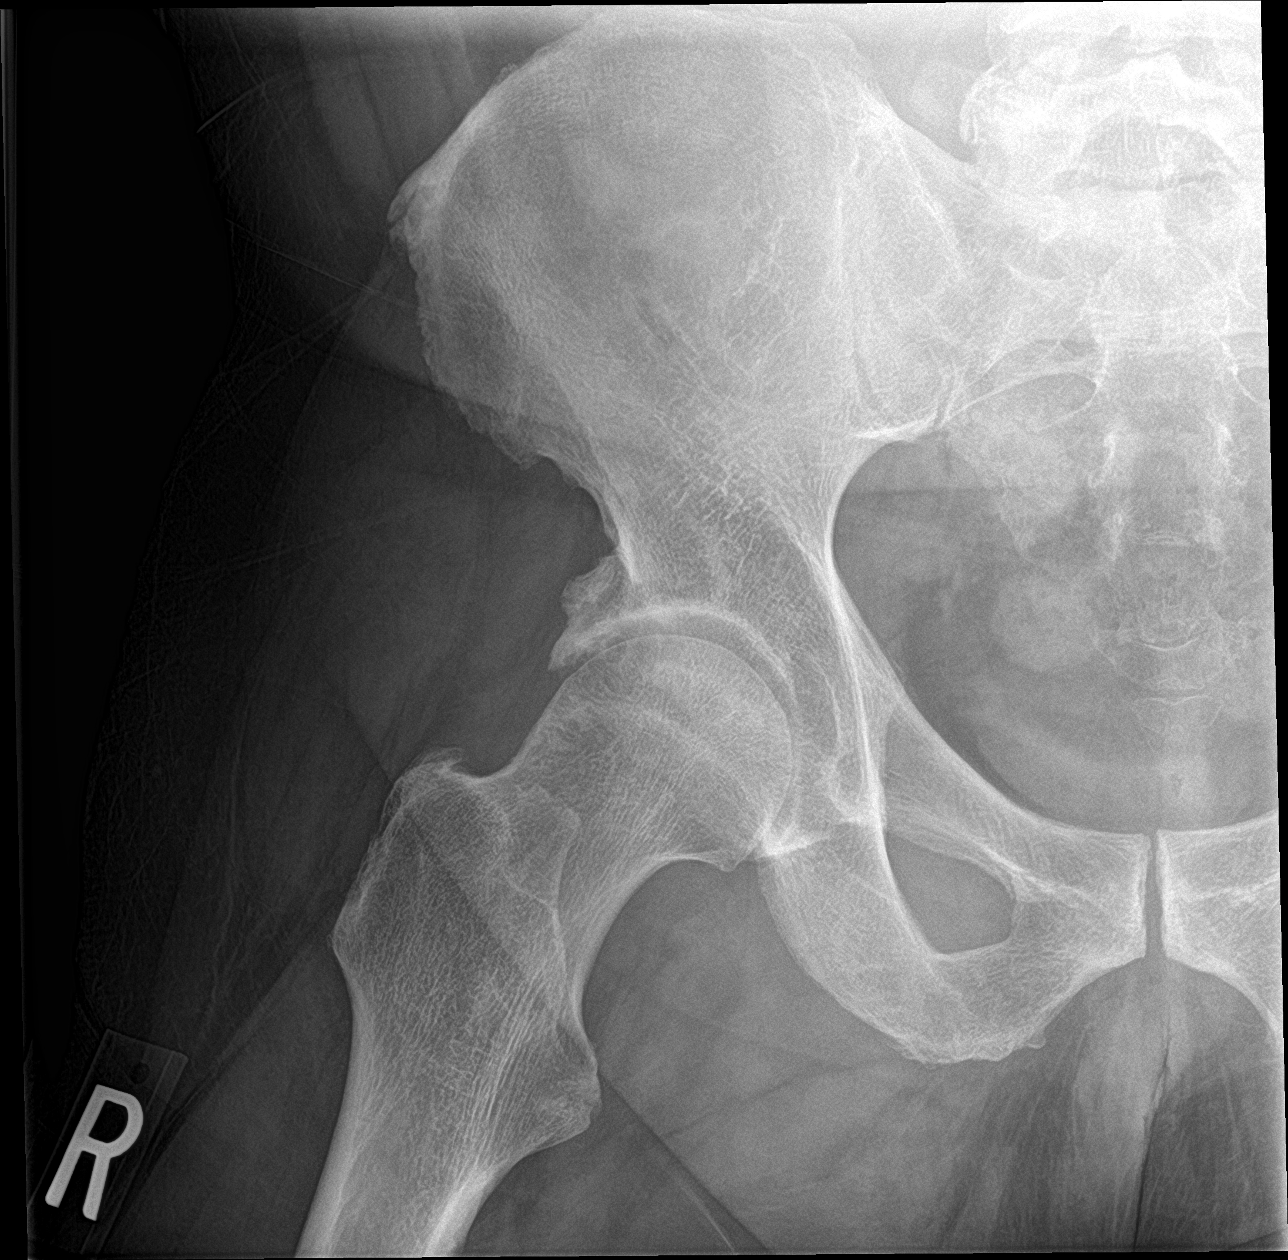

[3 of 3 positions shown; findings below may reference images not displayed]

FINDINGS: The mineralization and alignment are normal. There is no evidence of
acute fracture or dislocation. No evidence of femoral head avascular
necrosis. There are mild degenerative changes in the lower lumbar
spine and at both hips. Superior acetabular overgrowth is present
bilaterally, potentially contributing to pincer type
femoroacetabular impingement.
IMPRESSION: No acute osseous findings. Mild degenerative changes at both hips
with potential pincer type femoroacetabular impingement.

## 2018-08-11 MED FILL — PRAMIPEXOLE 0.5 MG TABLET: 0.5 | 30 days supply | Qty: 60 | Fill #2

## 2018-09-09 MED FILL — GABAPENTIN 400 MG CAPS: 400 | 30 days supply | Qty: 90 | Fill #1

## 2018-09-09 MED FILL — PRAMIPEXOLE 0.5 MG TABLET: 0.5 | 30 days supply | Qty: 60 | Fill #3

## 2018-09-15 MED FILL — LAMOTRIGINE 100 MG TABS: 100 | 30 days supply | Qty: 30 | Fill #1

## 2018-09-15 MED FILL — MIRTAZAPINE 15 MG TABLET: 15 | 30 days supply | Qty: 30 | Fill #1

## 2018-09-15 MED FILL — GABAPENTIN 300 MG CAPSULE: 300 | 30 days supply | Qty: 90 | Fill #1

## 2018-10-18 MED FILL — PRAMIPEXOLE 0.5 MG TABLET: 0.5 | 30 days supply | Qty: 60 | Fill #4

## 2018-11-02 MED FILL — MIRTAZAPINE 15 MG TABLET: 15 | 30 days supply | Qty: 30 | Fill #2

## 2018-11-02 MED FILL — GABAPENTIN 300 MG CAPSULE: 300 | 30 days supply | Qty: 90 | Fill #2

## 2018-11-02 MED FILL — traZODone HCL 100 MG TABS: 100 | 30 days supply | Qty: 75 | Fill #1

## 2018-11-02 MED FILL — GABAPENTIN 400 MG CAPS: 400 | 30 days supply | Qty: 90 | Fill #2

## 2018-11-02 MED FILL — LAMOTRIGINE 100 MG TABS: 100 | 30 days supply | Qty: 30 | Fill #2

## 2018-11-22 MED FILL — PRAMIPEXOLE 0.5 MG TABLET: 0.5 | 30 days supply | Qty: 60 | Fill #5

## 2018-12-09 MED FILL — GABAPENTIN 300 MG CAPSULE: 300 | 30 days supply | Qty: 90 | Fill #3

## 2018-12-23 MED FILL — PRAMIPEXOLE 0.5 MG TABLET: 0.5 | 30 days supply | Qty: 60 | Fill #0

## 2018-12-23 MED FILL — LAMOTRIGINE 100 MG TABS: 100 | 30 days supply | Qty: 30 | Fill #3

## 2018-12-23 MED FILL — MIRTAZAPINE 15 MG TABLET: 15 | 30 days supply | Qty: 30 | Fill #3

## 2018-12-23 MED FILL — GABAPENTIN 400 MG CAPSULE: 400 | 30 days supply | Qty: 90 | Fill #3

## 2019-01-05 DIAGNOSIS — F332 Major depressive disorder, recurrent severe without psychotic features: Secondary | ICD-10-CM | POA: Diagnosis not present

## 2019-01-05 DIAGNOSIS — F451 Undifferentiated somatoform disorder: Secondary | ICD-10-CM | POA: Diagnosis not present

## 2019-01-05 DIAGNOSIS — R69 Illness, unspecified: Secondary | ICD-10-CM | POA: Diagnosis not present

## 2019-01-05 MED FILL — GABAPENTIN 300 MG CAPSULE: 300 | 30 days supply | Qty: 90 | Fill #0

## 2019-01-05 MED FILL — traZODone HCL 100 MG TABS: 100 | 30 days supply | Qty: 75 | Fill #0

## 2019-01-16 MED FILL — GABAPENTIN 300 MG CAPSULE: 300 | 30 days supply | Qty: 90 | Fill #4

## 2019-01-16 MED FILL — traZODone HCL 100 MG TABS: 100 | 30 days supply | Qty: 75 | Fill #2

## 2019-01-26 MED FILL — PRAMIPEXOLE 0.5 MG TABLET: 0.5 | 30 days supply | Qty: 60 | Fill #1

## 2019-02-03 MED FILL — MIRTAZAPINE 15 MG TABLET: 15 | 30 days supply | Qty: 30 | Fill #4

## 2019-02-03 MED FILL — LAMOTRIGINE 100 MG TABS: 100 | 30 days supply | Qty: 30 | Fill #4

## 2019-02-08 ENCOUNTER — Other Ambulatory Visit: Payer: Self-pay

## 2019-02-08 ENCOUNTER — Emergency Department (INDEPENDENT_AMBULATORY_CARE_PROVIDER_SITE_OTHER)
Admission: EM | Admit: 2019-02-08 | Discharge: 2019-02-08 | Disposition: A | Discharge status: Home / Self Care | Payer: 59 | Source: Home / Self Care

## 2019-02-08 ENCOUNTER — Encounter: Payer: Self-pay | Admitting: *Deleted

## 2019-02-08 ENCOUNTER — Emergency Department (INDEPENDENT_AMBULATORY_CARE_PROVIDER_SITE_OTHER): Payer: 59

## 2019-02-08 DIAGNOSIS — R030 Elevated blood-pressure reading, without diagnosis of hypertension: Secondary | ICD-10-CM | POA: Diagnosis not present

## 2019-02-08 DIAGNOSIS — R062 Wheezing: Secondary | ICD-10-CM | POA: Diagnosis not present

## 2019-02-08 DIAGNOSIS — R05 Cough: Secondary | ICD-10-CM

## 2019-02-08 DIAGNOSIS — R509 Fever, unspecified: Secondary | ICD-10-CM

## 2019-02-08 DIAGNOSIS — R059 Cough, unspecified: Secondary | ICD-10-CM

## 2019-02-08 MED ORDER — BENZONATATE 100 MG PO CAPS: 100.0000 mg | ORAL_CAPSULE | Freq: Three times a day (TID) | ORAL | 0 refills | Status: DC

## 2019-02-08 MED ORDER — ALBUTEROL SULFATE HFA 108 (90 BASE) MCG/ACT IN AERS: 1.0000 | INHALATION_SPRAY | Freq: Four times a day (QID) | RESPIRATORY_TRACT | 0 refills | Status: AC | PRN

## 2019-02-08 MED ORDER — PREDNISONE 50 MG PO TABS: 50.0000 mg | ORAL_TABLET | Freq: Every day | ORAL | 0 refills | Status: AC

## 2019-02-08 MED ORDER — DEXAMETHASONE SODIUM PHOSPHATE 10 MG/ML IJ SOLN
10.0000 mg | Freq: Once | INTRAMUSCULAR | Status: AC
  Administered 2019-02-08: 10 mg via INTRAMUSCULAR

## 2019-02-08 MED FILL — predniSONE 50 MG TABS: 50 | 3 days supply | Qty: 3 | Fill #0

## 2019-02-08 MED FILL — BENZONATATE 100 MG CAPS: 100 | 5 days supply | Qty: 21 | Fill #0

## 2019-02-08 MED FILL — ALBUTEROL SULFATE HFA 108 (: 108 (90 BAS | 25 days supply | Qty: 18 | Fill #0

## 2019-02-08 MED FILL — GABAPENTIN 400 MG CAPSULE: 400 | 30 days supply | Qty: 90 | Fill #4

## 2019-02-08 NOTE — Discharge Instructions (Signed)
°  You may take 500mg  acetaminophen every 4-6 hours or in combination with ibuprofen 400-600mg  every 6-8 hours as needed for pain, inflammation, and fever.  Be sure to well hydrated with clear liquids and get at least 8 hours of sleep at night, preferably more while sick.   Please follow up with family medicine in 1 week if needed.  You were given a shot of solumedrol (a steroid) today to help with inflammation in your lungs to help you breath better and to help with your cough.  You have been prescribed 3 days of prednisone, an oral steroid.  You may start this medication tomorrow with breakfast.    Due to concern for possibly having Covid-19, it is advised that you self-isolate at home until test results come back.  If positive, it is recommended you stay isolated for at least 10 days after symptom onset and 24 hours after last fever without taking medication (whichever is longer).  If you MUST go out, please wear a mask at all times, limit contact with others.   Most results have been coming back within about 2 days.   If your results are negative, you will NOT be receiving a phone call. You may check your MyChart account, please see in this packet how to set on up if you do not already have one. There is also an app for phones you can download.   You WILL be notified for POSITIVE results.

## 2019-02-08 NOTE — ED Provider Notes (Signed)
[name]    CSN: [id number] Arrival date & time: 02/08/19  0957      History   Chief Complaint Chief Complaint  Patient presents with  . Cough    HPI Baer Snay is a 68 y.o. male.   HPI  Iliya Josephson is a 68 y.o. male presenting to UC with c/o 3 weeks of gradually worsening chest tightness with wheeze and intermittent minimally productive cough. Hx of asthma in the past and he has been on steroids and inhaler but has not needed his inhaler in may years.  He also reports subjective fever. Denies n/v/d. No chest pain, HA, sore throat or loss of taste or smell. No sick contacts or recent travel.   BP elevated. Hx of same but he is not on BP medication.    Past Medical History:  Diagnosis Date  . Asthma   . Depression   . Duodenal ulcer disease   . GERD (gastroesophageal reflux disease)   . Neuropathy    in legs  . Restless leg     Patient Active Problem List   Diagnosis Date Noted  . Nausea & vomiting 05/10/2012  . Chronic abdominal pain 05/10/2012  . Depression 05/09/2012  . Duodenal ulcer 05/09/2012  . GERD (gastroesophageal reflux disease) 05/09/2012  . Neuropathy of lower extremity 05/09/2012  . Restless leg syndrome 05/09/2012  . Gastric outlet obstruction 05/09/2012    Past Surgical History:  Procedure Laterality Date  . BACK SURGERY     cervical fusion   . CERVICAL FUSION    . CHOLECYSTECTOMY    . ESOPHAGOGASTRODUODENOSCOPY  05/10/2012   Procedure: ESOPHAGOGASTRODUODENOSCOPY (EGD);  Surgeon: Beryle Beams, MD;  Location: [facility];  Service: Endoscopy;  Laterality: N/A;       Home Medications    Prior to Admission medications   Medication Sig Start Date End Date Taking? Authorizing Provider  gabapentin (NEURONTIN) 300 MG capsule TAKE 1 CAPSULE 3  TIMES a day 06/26/16  Yes [provider]  lamoTRIgine (LAMICTAL) 100 MG tablet :take 1 at night 06/26/16  Yes [provider]  traZODone (DESYREL) 100 MG tablet  Take 2 &1/2 tabs at night 06/26/16  Yes [provider]  albuterol (VENTOLIN HFA) 108 (90 Base) MCG/ACT inhaler Inhale 1-2 puffs into the lungs every 6 (six) hours as needed for wheezing or shortness of breath. 02/08/19   Noe Gens, PA-C  benzonatate (TESSALON) 100 MG capsule Take 1-2 capsules (100-200 mg total) by mouth every 8 (eight) hours. 02/08/19   Noe Gens, PA-C  Mirtazapine (REMERON PO) Take by mouth.    [provider]  predniSONE (DELTASONE) 50 MG tablet Take 1 tablet (50 mg total) by mouth daily with breakfast for 3 days. 02/08/19 02/11/19  Noe Gens, PA-C    Family History Family History  Problem Relation Age of Onset  . Cancer Mother        breast    Social History Social History   Tobacco Use  . Smoking status: Former Smoker    Quit date: 05/09/1994    Years since quitting: 24.7  . Smokeless tobacco: Never Used  Substance Use Topics  . Alcohol use: No  . Drug use: No     Allergies   Patient has no known allergies.   Review of Systems Review of Systems  Constitutional: Positive for fever (subjective). Negative for chills.  HENT: Positive for congestion (minimal). Negative for ear pain, sore throat, trouble swallowing and voice change.  Respiratory: Positive for cough, chest tightness and wheezing. Negative for shortness of breath.   Cardiovascular: Negative for chest pain and palpitations.  Gastrointestinal: Negative for abdominal pain, diarrhea, nausea and vomiting.  Musculoskeletal: Negative for arthralgias, back pain and myalgias.  Skin: Negative for rash.  Neurological: Negative for dizziness, light-headedness and headaches.     Physical Exam Triage Vital Signs ED Triage Vitals [02/08/19 1017]  Enc Vitals Group     BP (!) 148/101     Pulse Rate 72     Resp 18     Temp 98 F (36.7 C)     Temp Source Oral     SpO2 97 %     Weight 245 lb (111.1 kg)     Height 5\' 10"  (1.778 m)     Head Circumference      Peak Flow       Pain Score 0     Pain Loc      Pain Edu?      Excl. in [place]?    No data found.  Updated Vital Signs BP (!) 161/93 (BP Location: Right Arm)   Pulse 72   Temp 98 F (36.7 C) (Oral)   Resp 18   Ht 5\' 10"  (1.778 m)   Wt 245 lb (111.1 kg)   SpO2 97%   BMI 35.15 kg/m   Visual Acuity Right Eye Distance:   Left Eye Distance:   Bilateral Distance:    Right Eye Near:   Left Eye Near:    Bilateral Near:     Physical Exam Vitals signs and nursing note reviewed.  Constitutional:      Appearance: Normal appearance. He is well-developed.  HENT:     Head: Normocephalic and atraumatic.     Right Ear: Tympanic membrane and ear canal normal.     Left Ear: Tympanic membrane and ear canal normal.     Nose: Nose normal.     Right Sinus: No maxillary sinus tenderness or frontal sinus tenderness.     Left Sinus: No maxillary sinus tenderness or frontal sinus tenderness.     Mouth/Throat:     Lips: Pink.     Mouth: Mucous membranes are moist.     Pharynx: Oropharynx is clear. Uvula midline.  Neck:     Musculoskeletal: Normal range of motion.  Cardiovascular:     Rate and Rhythm: Normal rate and regular rhythm.  Pulmonary:     Effort: Pulmonary effort is normal. No respiratory distress.     Breath sounds: No stridor. Wheezing (diffuse) present. No rhonchi.  Musculoskeletal: Normal range of motion.  Skin:    General: Skin is warm and dry.     Capillary Refill: Capillary refill takes less than 2 seconds.     Findings: No rash.  Neurological:     Mental Status: He is alert and oriented to person, place, and time.  Psychiatric:        Behavior: Behavior normal.      UC Treatments / Results  Labs (all labs ordered are listed, but only abnormal results are displayed) Labs Reviewed  NOVEL CORONAVIRUS, NAA    EKG   Radiology Dg Chest 2 View  Result Date: 02/08/2019 CLINICAL DATA:  Cough and wheezing with fever EXAM: CHEST - 2 VIEW COMPARISON:  September 01, 2012. FINDINGS:  Lungs are clear. Heart size and pulmonary vascularity are normal. No adenopathy. There is lower thoracic levoscoliosis. IMPRESSION: No edema or consolidation. Electronically Signed   By: Lowella Grip  III M.D.   On: 02/08/2019 10:53    Procedures Procedures (including critical care time)  Medications Ordered in UC Medications  dexamethasone (DECADRON) injection 10 mg (10 mg Intramuscular Given 02/08/19 1043)    Initial Impression / Assessment and Plan / UC Course  I have reviewed the triage vital signs and the nursing notes.  Pertinent labs & imaging results that were available during my care of the patient were reviewed by me and considered in my medical decision making (see chart for details).     Reviewed imaging with pt. No bronchitis or pneumonia Covid-19 test pending Will tx for asthma exacerbation F/u with PCP as needed  Final Clinical Impressions(s) / UC Diagnoses   Final diagnoses:  Cough  Wheeze  Elevated blood pressure reading     Discharge Instructions      You may take 500mg  acetaminophen every 4-6 hours or in combination with ibuprofen 400-600mg  every 6-8 hours as needed for pain, inflammation, and fever.  Be sure to well hydrated with clear liquids and get at least 8 hours of sleep at night, preferably more while sick.   Please follow up with family medicine in 1 week if needed.  You were given a shot of solumedrol (a steroid) today to help with inflammation in your lungs to help you breath better and to help with your cough.  You have been prescribed 3 days of prednisone, an oral steroid.  You may start this medication tomorrow with breakfast.    Due to concern for possibly having Covid-19, it is advised that you self-isolate at home until test results come back.  If positive, it is recommended you stay isolated for at least 10 days after symptom onset and 24 hours after last fever without taking medication (whichever is longer).  If you MUST go out,  please wear a mask at all times, limit contact with others.   Most results have been coming back within about 2 days.   If your results are negative, you will NOT be receiving a phone call. You may check your MyChart account, please see in this packet how to set on up if you do not already have one. There is also an app for phones you can download.   You WILL be notified for POSITIVE results.       ED Prescriptions    Medication Sig Dispense Auth. Provider   predniSONE (DELTASONE) 50 MG tablet Take 1 tablet (50 mg total) by mouth daily with breakfast for 3 days. 3 tablet Gerarda Fraction, Grete Bosko O, PA-C   albuterol (VENTOLIN HFA) 108 (90 Base) MCG/ACT inhaler Inhale 1-2 puffs into the lungs every 6 (six) hours as needed for wheezing or shortness of breath. 18 g Prithvi Kooi O, PA-C   benzonatate (TESSALON) 100 MG capsule Take 1-2 capsules (100-200 mg total) by mouth every 8 (eight) hours. 21 capsule Noe Gens, PA-C     Controlled Substance Prescriptions [place] Controlled Substance Registry consulted? Not Applicable   Tyrell Antonio 02/08/19 1346

## 2019-02-08 NOTE — ED Triage Notes (Signed)
Pt c/o cough, SOB and wheezing x 3 wks. Denies N/V/D. He is unsure if he has had a fever; he has felt hot and cold at times.

## 2019-02-10 LAB — NOVEL CORONAVIRUS, NAA: SARS-CoV-2, NAA: NOT DETECTED

## 2019-02-15 MED FILL — METHYLPREDNISOLONE 4 MG TBP: 4 | 6 days supply | Qty: 21 | Fill #0

## 2019-02-15 MED FILL — AZITHROMYCIN 250 MG TABLET: 250 | 5 days supply | Qty: 6 | Fill #0

## 2019-02-27 MED FILL — PRAMIPEXOLE 0.5 MG TABLET: 0.5 | 30 days supply | Qty: 60 | Fill #2

## 2019-03-16 MED FILL — GABAPENTIN 300 MG CAPSULE: 300 | 30 days supply | Qty: 90 | Fill #5

## 2019-03-24 MED FILL — LAMOTRIGINE 100 MG TABS: 100 | 30 days supply | Qty: 30 | Fill #5

## 2019-03-24 MED FILL — traZODone HCL 100 MG TABS: 100 | 30 days supply | Qty: 75 | Fill #3

## 2019-03-24 MED FILL — MIRTAZAPINE 15 MG TABLET: 15 | 30 days supply | Qty: 30 | Fill #5

## 2019-04-07 MED FILL — FLUAD QUADRIVALENT 0.5 ML P: 0.5 | 1 days supply | Qty: 1 | Fill #0

## 2019-04-07 MED FILL — GABAPENTIN 400 MG CAPSULE: 400 | 30 days supply | Qty: 90 | Fill #5

## 2019-04-07 MED FILL — PRAMIPEXOLE 0.5 MG TABLET: 0.5 | 30 days supply | Qty: 60 | Fill #3

## 2019-04-10 DIAGNOSIS — F451 Undifferentiated somatoform disorder: Secondary | ICD-10-CM | POA: Diagnosis not present

## 2019-04-10 DIAGNOSIS — F332 Major depressive disorder, recurrent severe without psychotic features: Secondary | ICD-10-CM | POA: Diagnosis not present

## 2019-04-10 DIAGNOSIS — G2581 Restless legs syndrome: Secondary | ICD-10-CM | POA: Diagnosis not present

## 2019-04-10 DIAGNOSIS — R69 Illness, unspecified: Secondary | ICD-10-CM | POA: Diagnosis not present

## 2019-05-09 MED FILL — GABAPENTIN 300 MG CAPSULE: 300 | 30 days supply | Qty: 90 | Fill #0

## 2019-05-09 MED FILL — MIRTAZAPINE 15 MG TABLET: 15 | 30 days supply | Qty: 30 | Fill #0

## 2019-05-09 MED FILL — PRAMIPEXOLE 0.5 MG TABLET: 0.5 | 30 days supply | Qty: 60 | Fill #4

## 2019-05-09 MED FILL — LAMOTRIGINE 100 MG TABS: 100 | 30 days supply | Qty: 30 | Fill #0

## 2019-05-25 MED FILL — GABAPENTIN 400 MG CAPSULE: 400 | 30 days supply | Qty: 90 | Fill #0

## 2019-06-06 MED FILL — PRAMIPEXOLE 0.5 MG TABLET: 0.5 | 30 days supply | Qty: 60 | Fill #5

## 2019-06-22 MED FILL — traZODone HCL 100 MG TABS: 100 | 30 days supply | Qty: 75 | Fill #4

## 2019-06-22 MED FILL — MIRTAZAPINE 15 MG TABLET: 15 | 30 days supply | Qty: 30 | Fill #1

## 2019-06-22 MED FILL — LAMOTRIGINE 100 MG TABS: 100 | 30 days supply | Qty: 30 | Fill #1

## 2019-07-08 ENCOUNTER — Other Ambulatory Visit: Payer: Self-pay

## 2019-07-08 ENCOUNTER — Encounter: Payer: Self-pay | Admitting: Emergency Medicine

## 2019-07-08 ENCOUNTER — Emergency Department (INDEPENDENT_AMBULATORY_CARE_PROVIDER_SITE_OTHER)
Admission: EM | Admit: 2019-07-08 | Discharge: 2019-07-08 | Disposition: A | Discharge status: Home / Self Care | Payer: 59 | Source: Home / Self Care | Attending: Emergency Medicine | Admitting: Emergency Medicine

## 2019-07-08 DIAGNOSIS — L03119 Cellulitis of unspecified part of limb: Secondary | ICD-10-CM | POA: Diagnosis not present

## 2019-07-08 MED ORDER — IBUPROFEN 600 MG PO TABS: 600.0000 mg | ORAL_TABLET | Freq: Four times a day (QID) | ORAL | 0 refills | Status: AC | PRN

## 2019-07-08 MED ORDER — CEFTRIAXONE SODIUM 1 G IJ SOLR
1000.0000 mg | Freq: Once | INTRAMUSCULAR | Status: AC
  Administered 2019-07-08: 13:00:00 1000 mg via INTRAMUSCULAR

## 2019-07-08 MED ORDER — DOXYCYCLINE HYCLATE 100 MG PO CAPS: 100.0000 mg | ORAL_CAPSULE | Freq: Two times a day (BID) | ORAL | 0 refills | Status: AC

## 2019-07-08 NOTE — ED Provider Notes (Signed)
HPI  SUBJECTIVE:  David Solis is a 69 y.o. male who presents with throbbing, constant left foot pain, swelling and discoloration after cutting the dorsum of his left foot over a week ago.  He is not sure what he cut it on.  He denies body aches, fevers, drainage from the wound.  He has tried Tylenol, soaking it, and Mercurochrome.  Symptoms are worse with elevation, worse with palpation.  No antipyretic in the past 4 to 6 hours.  His tetanus is up-to-date.  States it was 2 years ago.  He has a past medical history of peripheral neuropathy.  No history of diabetes, peripheral arterial disease, peripheral vascular disease, coronary disease, MRSA, smoking, chronic kidney disease, GI bleed, peptic ulcer disease.  PMD: None.   Past Medical History:  Diagnosis Date  . Asthma   . Depression   . Duodenal ulcer disease   . GERD (gastroesophageal reflux disease)   . Neuropathy    in legs  . Restless leg     Past Surgical History:  Procedure Laterality Date  . BACK SURGERY     cervical fusion   . CERVICAL FUSION    . CHOLECYSTECTOMY    . ESOPHAGOGASTRODUODENOSCOPY  05/10/2012   Procedure: ESOPHAGOGASTRODUODENOSCOPY (EGD);  Surgeon: Beryle Beams, MD;  Location: [facility];  Service: Endoscopy;  Laterality: N/A;    Family History  Problem Relation Age of Onset  . Cancer Mother        breast    Social History   Tobacco Use  . Smoking status: Former Smoker    Quit date: 05/09/1994    Years since quitting: 25.1  . Smokeless tobacco: Never Used  Substance Use Topics  . Alcohol use: No  . Drug use: No     Current Facility-Administered Medications:  .  cefTRIAXone (ROCEPHIN) injection 1,000 mg, 1,000 mg, Intramuscular, Once, Melynda Ripple, MD  Current Outpatient Medications:  .  albuterol (VENTOLIN HFA) 108 (90 Base) MCG/ACT inhaler, Inhale 1-2 puffs into the lungs every 6 (six) hours as needed for wheezing or shortness of breath., Disp: 18 g, Rfl: 0 .  doxycycline  (VIBRAMYCIN) 100 MG capsule, Take 1 capsule (100 mg total) by mouth 2 (two) times daily for 7 days., Disp: 14 capsule, Rfl: 0 .  gabapentin (NEURONTIN) 300 MG capsule, TAKE 1 CAPSULE 3  TIMES a day, Disp: , Rfl:  .  ibuprofen (ADVIL) 600 MG tablet, Take 1 tablet (600 mg total) by mouth every 6 (six) hours as needed., Disp: 30 tablet, Rfl: 0 .  lamoTRIgine (LAMICTAL) 100 MG tablet, :take 1 at night, Disp: , Rfl:  .  Mirtazapine (REMERON PO), Take by mouth., Disp: , Rfl:  .  traZODone (DESYREL) 100 MG tablet, Take 2 &1/2 tabs at night, Disp: , Rfl:   No Known Allergies   ROS  As noted in HPI.   Physical Exam  BP (!) 161/94 (BP Location: Right Arm)   Pulse 72   Temp 98.2 F (36.8 C) (Oral)   Wt 115.7 kg   SpO2 99%   BMI 36.59 kg/m   Constitutional: Well developed, well nourished, no acute distress Eyes:  EOMI, conjunctiva normal bilaterally HENT: Normocephalic, atraumatic,mucus membranes moist Respiratory: Normal inspiratory effort Cardiovascular: Normal rate GI: nondistended skin: 2 cm eschar left dorsum foot. Musculoskeletal: Bilateral feet both with dusky purplish discoloration.  Bilateral lower extremity edema, larger on the left.  Positive diffuse soft tissue swelling left foot, positive erythema, increased temperature surrounding eschar no expressible purulent drainage.  No bony tenderness, sensation grossly intact, patient able to move all toes.  DP 2+.        Neurologic: Alert & oriented x 3, no focal neuro deficits Psychiatric: Speech and behavior appropriate   ED Course   Medications  cefTRIAXone (ROCEPHIN) injection 1,000 mg (has no administration in time range)    No orders of the defined types were placed in this encounter.   No results found for this or any previous visit (from the past 24 hour(s)). No results found.  ED Clinical Impression  1. Cellulitis of foot      ED Assessment/Plan  Concern for cellulitis.  Suspect patient also has a  component of peripheral vascular disease given bilateral rubor.  Patient states that his tetanus is up-to-date.  Will give gram of Rocephin and sent home with 1 week of doxycycline.  Ibuprofen 600 mg combined with 1 g of Tylenol 3-4 times a day as needed for pain, follow-up with a primary care physician of his choice or here if not getting any better.  Providing primary care list.  To the ER if he gets worse.  Discussed medical decision-making, treatment plan and plan for follow-up with patient.  Discussed signs and symptoms that should prompt return to the emergency department.  Patient agrees with plan.  Meds ordered this encounter  Medications  . cefTRIAXone (ROCEPHIN) injection 1,000 mg    Order Specific Question:   Antibiotic Indication:    Answer:   Cellulitis  . doxycycline (VIBRAMYCIN) 100 MG capsule    Sig: Take 1 capsule (100 mg total) by mouth 2 (two) times daily for 7 days.    Dispense:  14 capsule    Refill:  0  . ibuprofen (ADVIL) 600 MG tablet    Sig: Take 1 tablet (600 mg total) by mouth every 6 (six) hours as needed.    Dispense:  30 tablet    Refill:  0    *This clinic note was created using [employer]. Therefore, there may be occasional mistakes despite careful proofreading.   ?    Melynda Ripple, MD 07/09/19 [phone]

## 2019-07-08 NOTE — ED Triage Notes (Signed)
Pt states he scratched his left foot on something in his house x1 week ago and states it is painful and not getting better.

## 2019-07-08 NOTE — Discharge Instructions (Addendum)
Finish the doxycycline, even if you feel better.  Continue elevating your foot is much as you can.  Keep this clean and dry.  Take ibuprofen 600 mg combined with 1 g of Tylenol 3-4 times a day as needed for pain,

## 2019-07-10 MED FILL — IBUPROFEN 600 MG TABLET: 600 | 8 days supply | Qty: 30 | Fill #0

## 2019-07-10 MED FILL — GABAPENTIN 400 MG CAPSULE: 400 | 30 days supply | Qty: 90 | Fill #1

## 2019-07-10 MED FILL — GABAPENTIN 300 MG CAPSULE: 300 | 30 days supply | Qty: 90 | Fill #1

## 2019-07-10 MED FILL — DOXYCYCLINE HYCLATE 100 MG: 100 | 7 days supply | Qty: 14 | Fill #0

## 2019-07-10 MED FILL — PRAMIPEXOLE 0.5 MG TABLET: 0.5 | 30 days supply | Qty: 60 | Fill #0

## 2019-07-19 ENCOUNTER — Emergency Department (INDEPENDENT_AMBULATORY_CARE_PROVIDER_SITE_OTHER): Payer: 59

## 2019-07-19 ENCOUNTER — Emergency Department (INDEPENDENT_AMBULATORY_CARE_PROVIDER_SITE_OTHER)
Admission: EM | Admit: 2019-07-19 | Discharge: 2019-07-19 | Disposition: A | Discharge status: Home / Self Care | Payer: 59 | Source: Home / Self Care | Attending: Family Medicine | Admitting: Family Medicine

## 2019-07-19 ENCOUNTER — Encounter: Payer: Self-pay | Admitting: Emergency Medicine

## 2019-07-19 ENCOUNTER — Other Ambulatory Visit: Payer: Self-pay

## 2019-07-19 DIAGNOSIS — R05 Cough: Secondary | ICD-10-CM | POA: Diagnosis not present

## 2019-07-19 DIAGNOSIS — R062 Wheezing: Secondary | ICD-10-CM | POA: Diagnosis not present

## 2019-07-19 DIAGNOSIS — R059 Cough, unspecified: Secondary | ICD-10-CM

## 2019-07-19 DIAGNOSIS — J209 Acute bronchitis, unspecified: Secondary | ICD-10-CM

## 2019-07-19 HISTORY — DX: Bronchitis, not specified as acute or chronic: J40

## 2019-07-19 MED ORDER — DOXYCYCLINE HYCLATE 100 MG PO CAPS: 100.0000 mg | ORAL_CAPSULE | Freq: Two times a day (BID) | ORAL | 0 refills | Status: AC

## 2019-07-19 MED ORDER — GUAIFENESIN-CODEINE 100-10 MG/5ML PO SOLN: ORAL | 0 refills | Status: AC

## 2019-07-19 MED ORDER — METHYLPREDNISOLONE SODIUM SUCC 125 MG IJ SOLR
80.0000 mg | Freq: Once | INTRAMUSCULAR | Status: AC
  Administered 2019-07-19: 17:00:00 80 mg via INTRAMUSCULAR

## 2019-07-19 MED ORDER — PREDNISONE 20 MG PO TABS: ORAL_TABLET | ORAL | 0 refills | Status: AC

## 2019-07-19 MED FILL — GUAIFENESIN-CODEINE SYRUP: 100-10 | 5 days supply | Qty: 50 | Fill #0

## 2019-07-19 MED FILL — predniSONE 20 MG TABS: 20 | 7 days supply | Qty: 11 | Fill #0

## 2019-07-19 MED FILL — DOXYCYCLINE HYCLATE 100 MG: 100 | 10 days supply | Qty: 20 | Fill #0

## 2019-07-19 NOTE — Discharge Instructions (Addendum)
Begin prednisone Thursday 07/20/19. Take plain guaifenesin (1200mg  extended release tabs such as Mucinex) twice daily, with plenty of water, for cough and congestion.  Get adequate rest.   Stop all antihistamines (Nyquil, etc) for now, and other non-prescription cough/cold preparations.   If symptoms become significantly worse during the night or over the weekend, proceed to the local emergency room.

## 2019-07-19 NOTE — ED Provider Notes (Signed)
[name]    CSN: [id number] Arrival date & time: 07/19/19  1419      History   Chief Complaint Chief Complaint  Patient presents with  . Cough  . Shortness of Breath    HPI David Solis is a 69 y.o. male.   Patient developed cough and shortness of breath about two weeks ago; a COVID19 test at that time was negative.  His cough has persisted, productive of clear mucous and worse at night.  Recently he has started to wheeze and feel shortness of breath, with tightness in his anterior chest.  He denies fevers, chills, and sweats, and no changes in taste/smell.  He had asthma as a child.  The history is provided by the patient.    Past Medical History:  Diagnosis Date  . Asthma   . Bronchitis   . Depression   . Duodenal ulcer disease   . GERD (gastroesophageal reflux disease)   . Neuropathy    in legs  . Restless leg     Patient Active Problem List   Diagnosis Date Noted  . Nausea & vomiting 05/10/2012  . Chronic abdominal pain 05/10/2012  . Depression 05/09/2012  . Duodenal ulcer 05/09/2012  . GERD (gastroesophageal reflux disease) 05/09/2012  . Neuropathy of lower extremity 05/09/2012  . Restless leg syndrome 05/09/2012  . Gastric outlet obstruction 05/09/2012    Past Surgical History:  Procedure Laterality Date  . BACK SURGERY     cervical fusion   . CERVICAL FUSION    . CHOLECYSTECTOMY    . ESOPHAGOGASTRODUODENOSCOPY  05/10/2012   Procedure: ESOPHAGOGASTRODUODENOSCOPY (EGD);  Surgeon: Beryle Beams, MD;  Location: [facility];  Service: Endoscopy;  Laterality: N/A;       Home Medications    Prior to Admission medications   Medication Sig Start Date End Date Taking? Authorizing Provider  albuterol (VENTOLIN HFA) 108 (90 Base) MCG/ACT inhaler Inhale 1-2 puffs into the lungs every 6 (six) hours as needed for wheezing or shortness of breath. 02/08/19   Noe Gens, PA-C  doxycycline (VIBRAMYCIN) 100 MG capsule Take 1 capsule (100 mg  total) by mouth 2 (two) times daily. Take with food. 07/19/19   [name], MD  gabapentin (NEURONTIN) 300 MG capsule TAKE 1 CAPSULE 3  TIMES a day 06/26/16   [provider]  guaiFENesin-codeine 100-10 MG/5ML syrup Take 82mL by mouth at bedtime as needed for cough. 07/19/19   [name], MD  ibuprofen (ADVIL) 600 MG tablet Take 1 tablet (600 mg total) by mouth every 6 (six) hours as needed. 07/08/19   Melynda Ripple, MD  lamoTRIgine (LAMICTAL) 100 MG tablet :take 1 at night 06/26/16   [provider]  Mirtazapine (REMERON PO) Take by mouth.    [provider]  predniSONE (DELTASONE) 20 MG tablet Take one tab by mouth twice daily for 4 days, then one daily for 3 days. Take with food. 07/19/19   [name], MD  traZODone (DESYREL) 100 MG tablet Take 2 &1/2 tabs at night 06/26/16   [provider]    Family History Family History  Problem Relation Age of Onset  . Cancer Mother        breast    Social History Social History   Tobacco Use  . Smoking status: Former Smoker    Quit date: 05/09/1994    Years since quitting: 25.2  . Smokeless tobacco: Never Used  Substance Use Topics  . Alcohol use: No  .  Drug use: No     Allergies   Patient has no known allergies.   Review of Systems Review of Systems No sore throat + cough No pleuritic pain, but feels tight in anterior chest + wheezing + nasal congestion + post-nasal drainage No sinus pain/pressure No itchy/red eyes No earache No hemoptysis + SOB No fever/chills No nausea No vomiting No abdominal pain No diarrhea No urinary symptoms No skin rash + fatigue No myalgias No headache Used OTC meds (Daquil and Nyquil) without relief   Physical Exam Triage Vital Signs ED Triage Vitals  Enc Vitals Group     BP 07/19/19 1531 (!) 151/97     Pulse Rate 07/19/19 1531 84     Resp 07/19/19 1531 16     Temp 07/19/19 1531 97.7 F (36.5 C)     Temp Source 07/19/19 1531 Oral      SpO2 07/19/19 1531 95 %     Weight 07/19/19 1532 253 lb 8.5 oz (115 kg)     Height 07/19/19 1532 5\' 10"  (1.778 m)     Head Circumference --      Peak Flow --      Pain Score 07/19/19 1532 0     Pain Loc --      Pain Edu? --      Excl. in [place]? --    No data found.  Updated Vital Signs BP (!) 151/97 (BP Location: Right Arm)   Pulse 84   Temp 97.7 F (36.5 C) (Oral)   Resp 16   Ht 5\' 10"  (1.778 m)   Wt 115 kg   SpO2 95%   BMI 36.38 kg/m   Visual Acuity Right Eye Distance:   Left Eye Distance:   Bilateral Distance:    Right Eye Near:   Left Eye Near:    Bilateral Near:     Physical Exam Nursing notes and Vital Signs reviewed. Appearance:  Patient appears stated age, and in no acute distress Eyes:  Pupils are equal, round, and reactive to light and accomodation.  Extraocular movement is intact.  Conjunctivae are not inflamed  Ears:  Canals normal.  Tympanic membranes normal.  Nose:  Mildly congested turbinates.  No sinus tenderness.   Pharynx:  Normal Neck:  Supple.  No adenopathy  Lungs:  Diffuse wheezes bilaterally.  Breath sounds are equal.  Moving air well. Heart:  Regular rate and rhythm without murmurs, rubs, or gallops.  Abdomen:  Nontender without masses or hepatosplenomegaly.  Bowel sounds are present.  No CVA or flank tenderness.  Extremities:  No edema.  Skin:  No rash present.   UC Treatments / Results  Labs (all labs ordered are listed, but only abnormal results are displayed) Labs Reviewed  NOVEL CORONAVIRUS, NAA    EKG   Radiology DG Chest 2 View  Result Date: 07/19/2019 CLINICAL DATA:  Persistent cough for 2 weeks, bilateral wheezing and rhonchi EXAM: CHEST - 2 VIEW COMPARISON:  02/08/2019 FINDINGS: Frontal and lateral views of the chest demonstrate a stable cardiac silhouette. No acute airspace disease, effusion, or pneumothorax. No acute bony abnormalities. IMPRESSION: 1. Stable exam, no acute process. Electronically Signed   By: Randa Ngo M.D.   On: 07/19/2019 16:38    Procedures Procedures (including critical care time)  Medications Ordered in UC Medications  methylPREDNISolone sodium succinate (SOLU-MEDROL) 125 mg/2 mL injection 80 mg (has no administration in time range)    Initial Impression / Assessment and Plan / UC Course  I  have reviewed the triage vital signs and the nursing notes.  Pertinent labs & imaging results that were available during my care of the patient were reviewed by me and considered in my medical decision making (see chart for details).    Begin doxycycline for atypical coverage. Administered Solumedrol 80mg  IM, then begin prednisone burst/taper. Rx for Robitussin AC for night time cough. Controlled Substance Prescriptions I have consulted the [place] Controlled Substances Registry for this patient, and feel the risk/benefit ratio today is favorable for proceeding with this prescription for a controlled substance.  COVID19 send out.  Followup with Family Doctor if not improved in about 10 days.   Final Clinical Impressions(s) / UC Diagnoses   Final diagnoses:  Cough  Bronchitis with bronchospasm     Discharge Instructions     Begin prednisone Thursday 07/20/19. Take plain guaifenesin (1200mg  extended release tabs such as Mucinex) twice daily, with plenty of water, for cough and congestion.  Get adequate rest.   Stop all antihistamines (Nyquil, etc) for now, and other non-prescription cough/cold preparations.   If symptoms become significantly worse during the night or over the weekend, proceed to the local emergency room.     ED Prescriptions    Medication Sig Dispense Auth. Provider   predniSONE (DELTASONE) 20 MG tablet Take one tab by mouth twice daily for 4 days, then one daily for 3 days. Take with food. 11 tablet [name], MD   doxycycline (VIBRAMYCIN) 100 MG capsule Take 1 capsule (100 mg total) by mouth 2 (two) times daily. Take with food. 20 capsule [name], MD   guaiFENesin-codeine 100-10 MG/5ML syrup Take 27mL by mouth at bedtime as needed for cough. 50 mL [name], MD        [name], MD 07/21/19 [phone]

## 2019-07-19 NOTE — ED Triage Notes (Signed)
Patient here for cough and mild shortness of breath; started about 2 weeks ago and was tested for covid 07/08/19 during visit for foot infection=negative. No fever or other symptoms. Has not had influenza vacc this season. No known exposure to covid positive person.

## 2019-07-20 LAB — NOVEL CORONAVIRUS, NAA: SARS-CoV-2, NAA: NOT DETECTED

## 2019-07-24 DIAGNOSIS — G2581 Restless legs syndrome: Secondary | ICD-10-CM | POA: Diagnosis not present

## 2019-07-24 DIAGNOSIS — F332 Major depressive disorder, recurrent severe without psychotic features: Secondary | ICD-10-CM | POA: Diagnosis not present

## 2019-07-24 DIAGNOSIS — F451 Undifferentiated somatoform disorder: Secondary | ICD-10-CM | POA: Diagnosis not present

## 2019-07-24 DIAGNOSIS — R69 Illness, unspecified: Secondary | ICD-10-CM | POA: Diagnosis not present

## 2019-07-24 MED FILL — traZODone HCL 100 MG TABS: 100 | 30 days supply | Qty: 75 | Fill #0

## 2019-07-24 MED FILL — LAMOTRIGINE 100 MG TABS: 100 | 30 days supply | Qty: 30 | Fill #0

## 2019-07-24 MED FILL — MIRTAZAPINE 15 MG TABLET: 15 | 30 days supply | Qty: 30 | Fill #0

## 2019-08-11 MED FILL — PRAMIPEXOLE 0.5 MG TABLET: 0.5 | 30 days supply | Qty: 60 | Fill #1

## 2019-08-31 MED FILL — GABAPENTIN 400 MG CAPSULE: 400 | 30 days supply | Qty: 90 | Fill #2

## 2019-09-06 MED FILL — GABAPENTIN 300 MG CAPSULE: 300 | 30 days supply | Qty: 90 | Fill #2

## 2019-09-06 MED FILL — PRAMIPEXOLE 0.5 MG TABLET: 0.5 | 30 days supply | Qty: 60 | Fill #2

## 2019-09-24 IMAGING — DX DG HAND COMPLETE 3+V*R*
3 series · 3 of 3 positions shown · non-contrast
Comparison: None.

CLINICAL DATA: Abrasion of right index finger with infection

EXAM:
RIGHT HAND - COMPLETE 3+ VIEW

[hand pa]
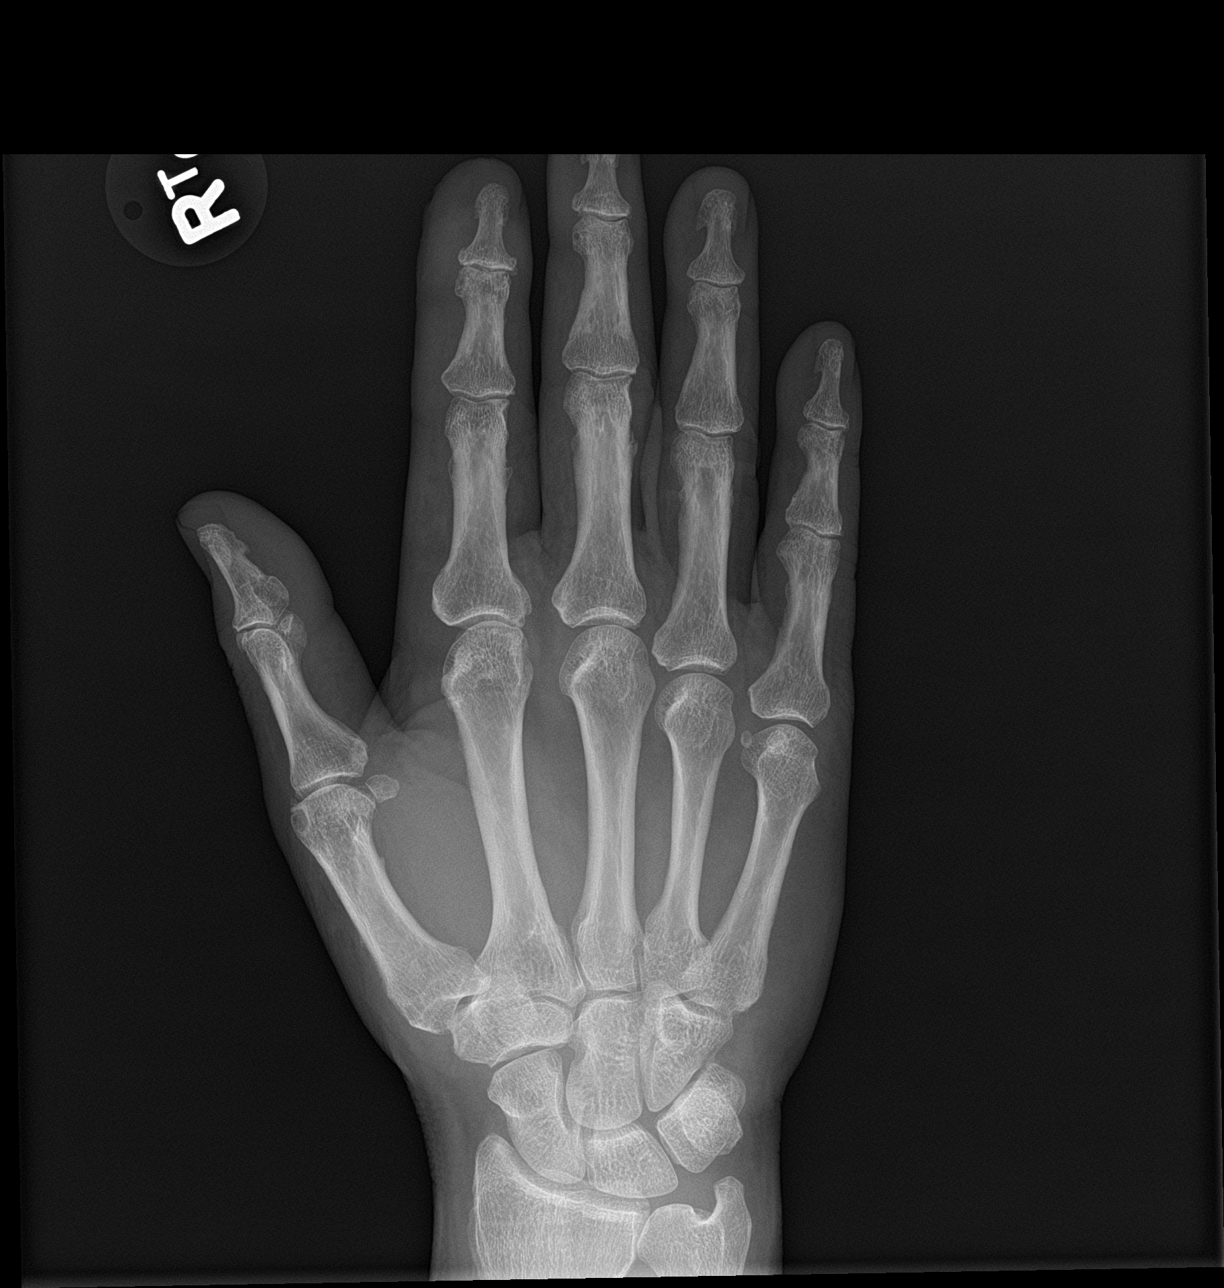

[hand obl]
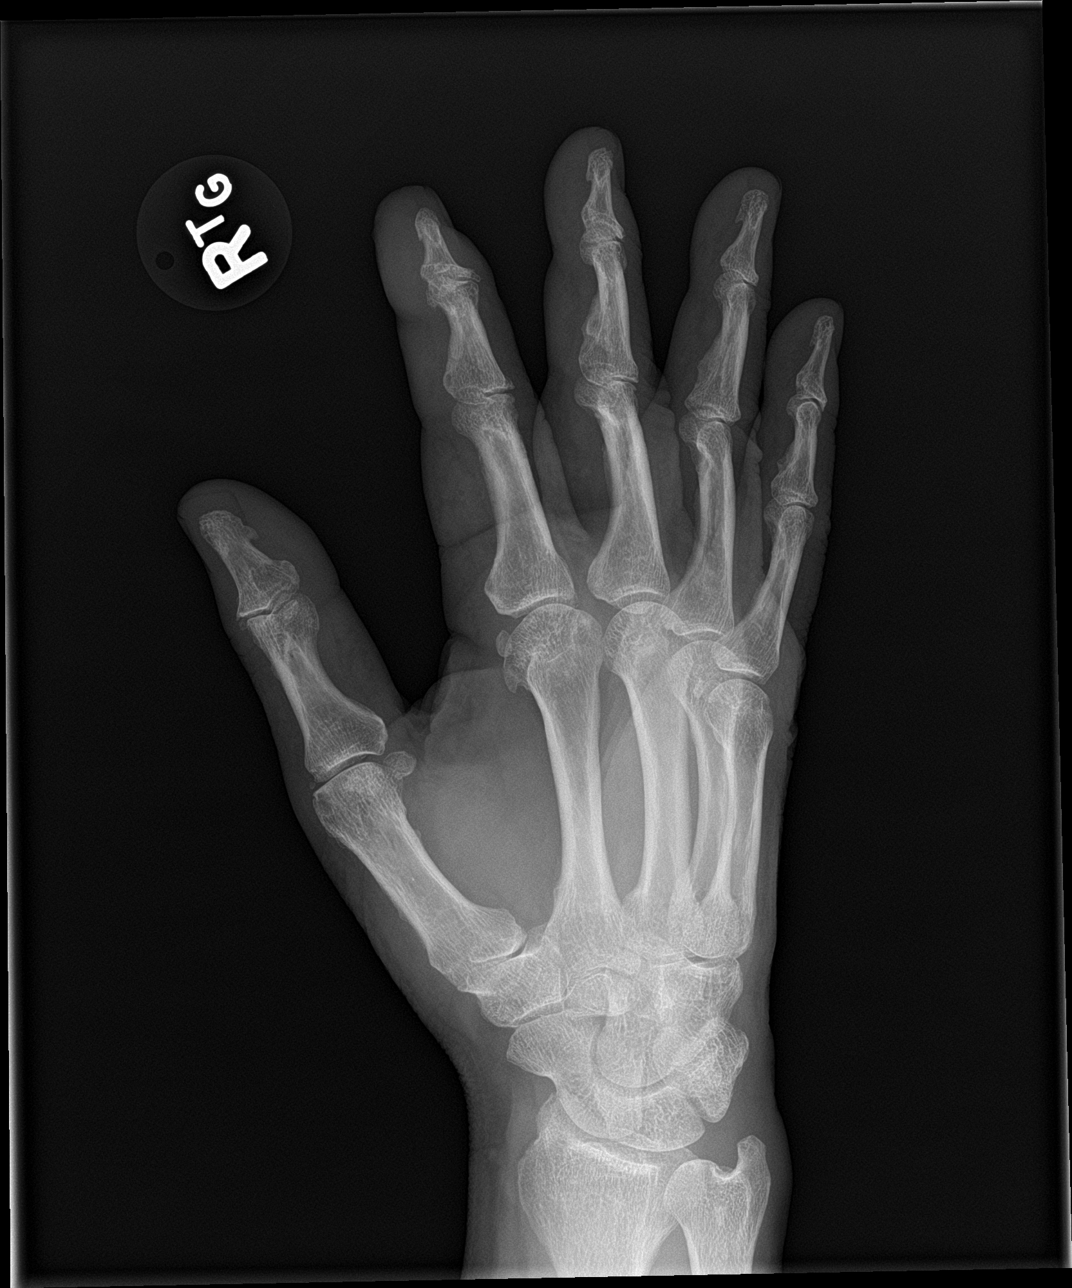

[hand lat]
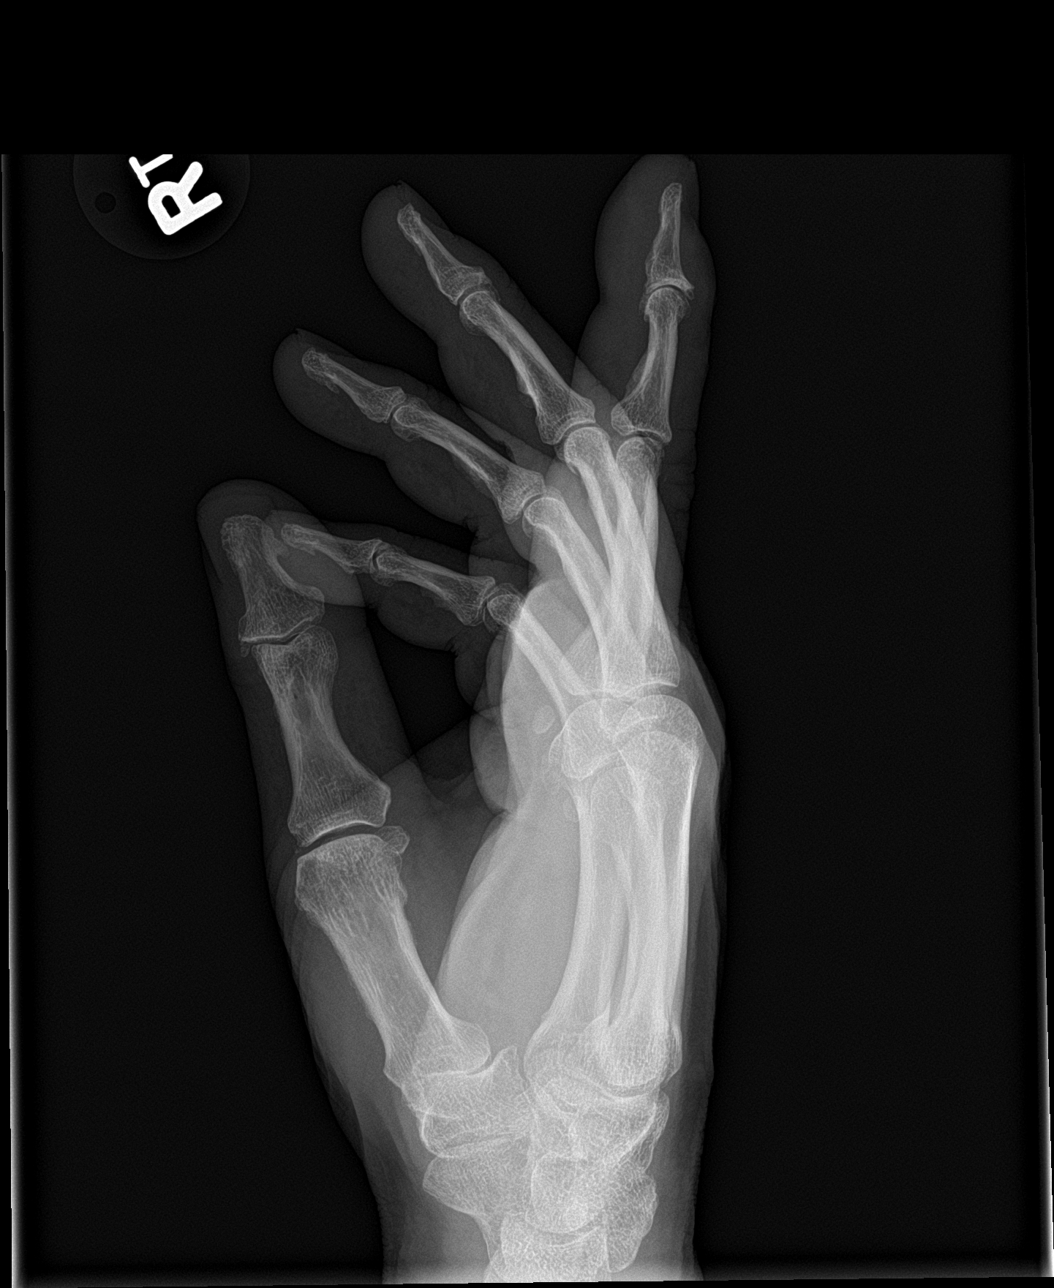

[3 of 3 positions shown; findings below may reference images not displayed]

FINDINGS: There is no evidence of fracture or dislocation. There is no
evidence of arthropathy or other focal bone abnormality. Soft
tissues are unremarkable.
IMPRESSION: No significant abnormality seen in the right hand.

## 2019-10-11 MED FILL — LAMOTRIGINE 100 MG TABS: 100 | 30 days supply | Qty: 30 | Fill #1

## 2019-10-11 MED FILL — MIRTAZAPINE 15 MG TABLET: 15 | 30 days supply | Qty: 30 | Fill #1

## 2019-10-13 MED FILL — PRAMIPEXOLE 0.5 MG TABLET: 0.5 | 30 days supply | Qty: 60 | Fill #3

## 2019-10-19 MED FILL — GABAPENTIN 400 MG CAPSULE: 400 | 30 days supply | Qty: 90 | Fill #3

## 2019-10-26 DIAGNOSIS — R69 Illness, unspecified: Secondary | ICD-10-CM | POA: Diagnosis not present

## 2019-10-26 DIAGNOSIS — F451 Undifferentiated somatoform disorder: Secondary | ICD-10-CM | POA: Diagnosis not present

## 2019-10-26 DIAGNOSIS — G2581 Restless legs syndrome: Secondary | ICD-10-CM | POA: Diagnosis not present

## 2019-11-14 MED FILL — PRAMIPEXOLE 0.5 MG TABLET: 0.5 | 30 days supply | Qty: 60 | Fill #4

## 2019-11-21 DIAGNOSIS — I10 Essential (primary) hypertension: Secondary | ICD-10-CM | POA: Diagnosis not present

## 2019-11-21 DIAGNOSIS — R69 Illness, unspecified: Secondary | ICD-10-CM | POA: Diagnosis not present

## 2019-11-21 DIAGNOSIS — Z125 Encounter for screening for malignant neoplasm of prostate: Secondary | ICD-10-CM | POA: Diagnosis not present

## 2019-11-21 DIAGNOSIS — F339 Major depressive disorder, recurrent, unspecified: Secondary | ICD-10-CM | POA: Diagnosis not present

## 2019-11-21 DIAGNOSIS — J811 Chronic pulmonary edema: Secondary | ICD-10-CM | POA: Diagnosis not present

## 2019-11-21 DIAGNOSIS — R609 Edema, unspecified: Secondary | ICD-10-CM | POA: Diagnosis not present

## 2019-11-27 MED FILL — traZODone HCL 100 MG TABS: 100 | 30 days supply | Qty: 75 | Fill #1

## 2019-11-27 MED FILL — GABAPENTIN 400 MG CAPSULE: 400 | 30 days supply | Qty: 90 | Fill #4

## 2019-11-27 MED FILL — GABAPENTIN 300 MG CAPSULE: 300 | 30 days supply | Qty: 90 | Fill #3

## 2019-12-12 DIAGNOSIS — R29708 NIHSS score 8: Secondary | ICD-10-CM | POA: Diagnosis not present

## 2019-12-12 DIAGNOSIS — R414 Neurologic neglect syndrome: Secondary | ICD-10-CM | POA: Diagnosis not present

## 2019-12-12 DIAGNOSIS — R519 Headache, unspecified: Secondary | ICD-10-CM | POA: Diagnosis not present

## 2019-12-12 DIAGNOSIS — R0902 Hypoxemia: Secondary | ICD-10-CM | POA: Diagnosis not present

## 2019-12-12 DIAGNOSIS — G2581 Restless legs syndrome: Secondary | ICD-10-CM | POA: Diagnosis not present

## 2019-12-12 DIAGNOSIS — G8191 Hemiplegia, unspecified affecting right dominant side: Secondary | ICD-10-CM | POA: Diagnosis not present

## 2019-12-12 DIAGNOSIS — I6322 Cerebral infarction due to unspecified occlusion or stenosis of basilar arteries: Secondary | ICD-10-CM | POA: Diagnosis not present

## 2019-12-12 DIAGNOSIS — Q211 Atrial septal defect: Secondary | ICD-10-CM | POA: Diagnosis not present

## 2019-12-12 DIAGNOSIS — G8194 Hemiplegia, unspecified affecting left nondominant side: Secondary | ICD-10-CM | POA: Diagnosis not present

## 2019-12-12 DIAGNOSIS — G4489 Other headache syndrome: Secondary | ICD-10-CM | POA: Diagnosis not present

## 2019-12-12 DIAGNOSIS — G629 Polyneuropathy, unspecified: Secondary | ICD-10-CM | POA: Diagnosis not present

## 2019-12-12 DIAGNOSIS — R531 Weakness: Secondary | ICD-10-CM | POA: Diagnosis not present

## 2019-12-12 DIAGNOSIS — G5793 Unspecified mononeuropathy of bilateral lower limbs: Secondary | ICD-10-CM | POA: Diagnosis not present

## 2019-12-12 DIAGNOSIS — I639 Cerebral infarction, unspecified: Secondary | ICD-10-CM | POA: Diagnosis not present

## 2019-12-12 DIAGNOSIS — R69 Illness, unspecified: Secondary | ICD-10-CM | POA: Diagnosis not present

## 2019-12-12 DIAGNOSIS — R410 Disorientation, unspecified: Secondary | ICD-10-CM | POA: Diagnosis not present

## 2019-12-12 DIAGNOSIS — I6349 Cerebral infarction due to embolism of other cerebral artery: Secondary | ICD-10-CM | POA: Diagnosis not present

## 2019-12-12 DIAGNOSIS — R200 Anesthesia of skin: Secondary | ICD-10-CM | POA: Diagnosis not present

## 2019-12-12 DIAGNOSIS — I10 Essential (primary) hypertension: Secondary | ICD-10-CM | POA: Diagnosis not present

## 2019-12-12 DIAGNOSIS — R600 Localized edema: Secondary | ICD-10-CM | POA: Diagnosis not present

## 2019-12-12 DIAGNOSIS — G8324 Monoplegia of upper limb affecting left nondominant side: Secondary | ICD-10-CM | POA: Diagnosis not present

## 2019-12-12 DIAGNOSIS — R404 Transient alteration of awareness: Secondary | ICD-10-CM | POA: Diagnosis not present

## 2019-12-12 DIAGNOSIS — Z8673 Personal history of transient ischemic attack (TIA), and cerebral infarction without residual deficits: Secondary | ICD-10-CM | POA: Diagnosis not present

## 2019-12-12 DIAGNOSIS — Z720 Tobacco use: Secondary | ICD-10-CM | POA: Diagnosis not present

## 2019-12-12 DIAGNOSIS — I739 Peripheral vascular disease, unspecified: Secondary | ICD-10-CM | POA: Diagnosis not present

## 2019-12-14 MED FILL — ASPIRIN ADULT LOW STRENGTH: 81 | 30 days supply | Qty: 30 | Fill #0

## 2019-12-14 MED FILL — ATORVASTATIN CALCIUM 40 MG: 40 | 30 days supply | Qty: 30 | Fill #0

## 2019-12-15 DIAGNOSIS — I639 Cerebral infarction, unspecified: Secondary | ICD-10-CM | POA: Diagnosis not present

## 2019-12-15 DIAGNOSIS — I10 Essential (primary) hypertension: Secondary | ICD-10-CM | POA: Diagnosis not present

## 2019-12-15 DIAGNOSIS — Z09 Encounter for follow-up examination after completed treatment for conditions other than malignant neoplasm: Secondary | ICD-10-CM | POA: Diagnosis not present

## 2019-12-15 MED FILL — VITAMIN B-12 1000 MCG TABS: 1000 | 100 days supply | Qty: 100 | Fill #0

## 2019-12-20 MED FILL — FUROSEMIDE 20 MG TAB: 20 | 30 days supply | Qty: 30 | Fill #1

## 2019-12-20 MED FILL — PRAMIPEXOLE 0.5 MG TABLET: 0.5 | 30 days supply | Qty: 60 | Fill #5

## 2020-01-08 MED FILL — MIRTAZAPINE 15 MG TABLET: 15 | 30 days supply | Qty: 30 | Fill #3

## 2020-01-08 MED FILL — LAMOTRIGINE 100 MG TABS: 100 | 30 days supply | Qty: 30 | Fill #3

## 2020-01-08 MED FILL — GABAPENTIN 400 MG CAPSULE: 400 | 30 days supply | Qty: 90 | Fill #0

## 2020-01-10 DIAGNOSIS — I639 Cerebral infarction, unspecified: Secondary | ICD-10-CM | POA: Diagnosis not present

## 2020-01-10 DIAGNOSIS — I471 Supraventricular tachycardia: Secondary | ICD-10-CM | POA: Diagnosis not present

## 2020-01-22 MED FILL — GABAPENTIN 300 MG CAPSULE: 300 | 30 days supply | Qty: 90 | Fill #0

## 2020-01-22 MED FILL — traZODone HCL 100 MG TABS: 100 | 30 days supply | Qty: 75 | Fill #2

## 2020-01-22 MED FILL — PRAMIPEXOLE 0.5 MG TABLET: 0.5 | 30 days supply | Qty: 60 | Fill #0

## 2020-02-12 MED FILL — GABAPENTIN 400 MG CAPSULE: 400 | 30 days supply | Qty: 90 | Fill #1

## 2020-02-26 MED FILL — PRAMIPEXOLE 0.5 MG TABLET: 0.5 | 30 days supply | Qty: 60 | Fill #1

## 2020-02-26 MED FILL — ASPIRIN ADULT LOW STRENGTH: 81 | 30 days supply | Qty: 30 | Fill #1
